# Patient Record
Sex: Female | Born: 1955
Health system: Southern US, Community
[De-identification: ages and names within clinical notes are randomized; demographics above are authoritative.]

## PROBLEM LIST (undated history)

## (undated) ENCOUNTER — Emergency Department: Admission: EM | Payer: BC Managed Care – PPO

## (undated) DIAGNOSIS — G5700 Lesion of sciatic nerve, unspecified lower limb: Secondary | ICD-10-CM

## (undated) DIAGNOSIS — I10 Essential (primary) hypertension: Secondary | ICD-10-CM

## (undated) DIAGNOSIS — I491 Atrial premature depolarization: Secondary | ICD-10-CM

## (undated) DIAGNOSIS — T7840XA Allergy, unspecified, initial encounter: Secondary | ICD-10-CM

## (undated) DIAGNOSIS — G709 Myoneural disorder, unspecified: Secondary | ICD-10-CM

## (undated) DIAGNOSIS — R011 Cardiac murmur, unspecified: Secondary | ICD-10-CM

## (undated) DIAGNOSIS — H269 Unspecified cataract: Secondary | ICD-10-CM

## (undated) DIAGNOSIS — D649 Anemia, unspecified: Secondary | ICD-10-CM

## (undated) DIAGNOSIS — M1990 Unspecified osteoarthritis, unspecified site: Secondary | ICD-10-CM

## (undated) DIAGNOSIS — C801 Malignant (primary) neoplasm, unspecified: Secondary | ICD-10-CM

## (undated) HISTORY — DX: Atrial premature depolarization: I49.1

## (undated) HISTORY — DX: Anemia, unspecified: D64.9

## (undated) HISTORY — DX: Myoneural disorder, unspecified: G70.9

## (undated) HISTORY — PX: OTHER SURGICAL HISTORY: SHX169

## (undated) HISTORY — DX: Unspecified cataract: H26.9

## (undated) HISTORY — PX: BREAST RECONSTRUCTION: SHX9

## (undated) HISTORY — DX: Lesion of sciatic nerve, unspecified lower limb: G57.00

## (undated) HISTORY — PX: COLONOSCOPY: SHX174

## (undated) HISTORY — DX: Malignant (primary) neoplasm, unspecified: C80.1

## (undated) HISTORY — PX: BREAST LUMPECTOMY WITH AXILLARY LYMPH NODE DISSECTION: SHX5756

## (undated) HISTORY — DX: Unspecified osteoarthritis, unspecified site: M19.90

## (undated) HISTORY — DX: Cardiac murmur, unspecified: R01.1

## (undated) HISTORY — DX: Allergy, unspecified, initial encounter: T78.40XA

---

## 1997-05-31 ENCOUNTER — Ambulatory Visit (HOSPITAL_BASED_OUTPATIENT_CLINIC_OR_DEPARTMENT_OTHER): Admission: RE | Admit: 1997-05-31 | Discharge: 1997-05-31 | Payer: Self-pay | Admitting: General Surgery

## 1999-05-17 ENCOUNTER — Encounter: Admission: RE | Admit: 1999-05-17 | Discharge: 1999-05-17 | Payer: Self-pay | Admitting: *Deleted

## 1999-05-21 ENCOUNTER — Other Ambulatory Visit: Admission: RE | Admit: 1999-05-21 | Discharge: 1999-05-21 | Payer: Self-pay | Admitting: *Deleted

## 2000-05-28 ENCOUNTER — Encounter: Admission: RE | Admit: 2000-05-28 | Discharge: 2000-05-28 | Payer: Self-pay | Admitting: *Deleted

## 2000-06-26 ENCOUNTER — Other Ambulatory Visit: Admission: RE | Admit: 2000-06-26 | Discharge: 2000-06-26 | Payer: Self-pay | Admitting: *Deleted

## 2001-07-08 ENCOUNTER — Other Ambulatory Visit: Admission: RE | Admit: 2001-07-08 | Discharge: 2001-07-08 | Payer: Self-pay | Admitting: *Deleted

## 2001-07-22 ENCOUNTER — Encounter: Admission: RE | Admit: 2001-07-22 | Discharge: 2001-07-22 | Payer: Self-pay | Admitting: *Deleted

## 2001-12-09 ENCOUNTER — Encounter: Payer: Self-pay | Admitting: Family Medicine

## 2001-12-09 ENCOUNTER — Ambulatory Visit (HOSPITAL_COMMUNITY): Admission: RE | Admit: 2001-12-09 | Discharge: 2001-12-09 | Payer: Self-pay | Admitting: Family Medicine

## 2002-07-26 ENCOUNTER — Encounter: Admission: RE | Admit: 2002-07-26 | Discharge: 2002-07-26 | Payer: Self-pay | Admitting: *Deleted

## 2002-08-18 ENCOUNTER — Other Ambulatory Visit: Admission: RE | Admit: 2002-08-18 | Discharge: 2002-08-18 | Payer: Self-pay | Admitting: *Deleted

## 2003-07-04 ENCOUNTER — Ambulatory Visit (HOSPITAL_COMMUNITY): Admission: RE | Admit: 2003-07-04 | Discharge: 2003-07-04 | Payer: Self-pay | Admitting: Family Medicine

## 2003-08-24 ENCOUNTER — Encounter: Admission: RE | Admit: 2003-08-24 | Discharge: 2003-08-24 | Payer: Self-pay | Admitting: *Deleted

## 2003-11-08 ENCOUNTER — Ambulatory Visit (HOSPITAL_COMMUNITY): Admission: RE | Admit: 2003-11-08 | Discharge: 2003-11-08 | Payer: Self-pay | Admitting: Family Medicine

## 2004-09-09 ENCOUNTER — Encounter: Admission: RE | Admit: 2004-09-09 | Discharge: 2004-09-09 | Payer: Self-pay | Admitting: *Deleted

## 2005-10-27 ENCOUNTER — Encounter: Admission: RE | Admit: 2005-10-27 | Discharge: 2005-10-27 | Payer: Self-pay | Admitting: *Deleted

## 2006-09-14 ENCOUNTER — Ambulatory Visit: Payer: Self-pay | Admitting: Internal Medicine

## 2006-10-05 ENCOUNTER — Ambulatory Visit: Payer: Self-pay | Admitting: Internal Medicine

## 2006-11-02 ENCOUNTER — Encounter: Admission: RE | Admit: 2006-11-02 | Discharge: 2006-11-02 | Payer: Self-pay | Admitting: Family Medicine

## 2007-11-17 ENCOUNTER — Encounter: Admission: RE | Admit: 2007-11-17 | Discharge: 2007-11-17 | Payer: Self-pay | Admitting: Family Medicine

## 2008-12-04 ENCOUNTER — Encounter: Admission: RE | Admit: 2008-12-04 | Discharge: 2008-12-04 | Payer: Self-pay | Admitting: Obstetrics and Gynecology

## 2009-12-07 ENCOUNTER — Encounter: Admission: RE | Admit: 2009-12-07 | Discharge: 2009-12-07 | Payer: Self-pay | Admitting: Family Medicine

## 2011-09-09 ENCOUNTER — Other Ambulatory Visit: Payer: Self-pay | Admitting: Family Medicine

## 2011-09-09 DIAGNOSIS — Z1231 Encounter for screening mammogram for malignant neoplasm of breast: Secondary | ICD-10-CM

## 2011-09-30 ENCOUNTER — Ambulatory Visit
Admission: RE | Admit: 2011-09-30 | Discharge: 2011-09-30 | Disposition: A | Discharge status: Home / Self Care | Payer: Self-pay | Source: Ambulatory Visit | Attending: Family Medicine | Admitting: Family Medicine

## 2011-09-30 DIAGNOSIS — Z1231 Encounter for screening mammogram for malignant neoplasm of breast: Secondary | ICD-10-CM

## 2012-11-25 ENCOUNTER — Ambulatory Visit: Payer: Self-pay

## 2012-11-25 LAB — RAPID STREP-A WITH REFLX: Micro Text Report: NEGATIVE

## 2012-11-28 LAB — BETA STREP CULTURE(ARMC)

## 2012-12-23 ENCOUNTER — Other Ambulatory Visit: Payer: Self-pay

## 2012-12-23 DIAGNOSIS — Z853 Personal history of malignant neoplasm of breast: Secondary | ICD-10-CM

## 2012-12-23 DIAGNOSIS — Z1231 Encounter for screening mammogram for malignant neoplasm of breast: Secondary | ICD-10-CM

## 2012-12-23 DIAGNOSIS — Z9011 Acquired absence of right breast and nipple: Secondary | ICD-10-CM

## 2013-01-27 ENCOUNTER — Ambulatory Visit
Admission: RE | Admit: 2013-01-27 | Discharge: 2013-01-27 | Disposition: A | Discharge status: Home / Self Care | Payer: BC Managed Care – PPO | Source: Ambulatory Visit

## 2013-01-27 ENCOUNTER — Other Ambulatory Visit: Payer: Self-pay

## 2013-01-27 DIAGNOSIS — Z9011 Acquired absence of right breast and nipple: Secondary | ICD-10-CM

## 2013-01-27 DIAGNOSIS — Z853 Personal history of malignant neoplasm of breast: Secondary | ICD-10-CM

## 2013-01-27 DIAGNOSIS — Z1231 Encounter for screening mammogram for malignant neoplasm of breast: Secondary | ICD-10-CM

## 2015-02-09 ENCOUNTER — Encounter: Payer: Self-pay | Admitting: Internal Medicine

## 2015-05-22 ENCOUNTER — Ambulatory Visit
Admission: RE | Admit: 2015-05-22 | Discharge: 2015-05-22 | Disposition: A | Discharge status: Home / Self Care | Payer: BLUE CROSS/BLUE SHIELD | Source: Ambulatory Visit | Attending: Family Medicine | Admitting: Family Medicine

## 2015-05-22 ENCOUNTER — Other Ambulatory Visit: Payer: Self-pay | Admitting: Family Medicine

## 2015-05-22 DIAGNOSIS — M79672 Pain in left foot: Secondary | ICD-10-CM

## 2015-06-06 DIAGNOSIS — A084 Viral intestinal infection, unspecified: Secondary | ICD-10-CM | POA: Diagnosis not present

## 2015-06-27 DIAGNOSIS — F430 Acute stress reaction: Secondary | ICD-10-CM | POA: Diagnosis not present

## 2015-07-03 DIAGNOSIS — Z1231 Encounter for screening mammogram for malignant neoplasm of breast: Secondary | ICD-10-CM | POA: Diagnosis not present

## 2015-07-03 DIAGNOSIS — Z01419 Encounter for gynecological examination (general) (routine) without abnormal findings: Secondary | ICD-10-CM | POA: Diagnosis not present

## 2015-07-03 DIAGNOSIS — N760 Acute vaginitis: Secondary | ICD-10-CM | POA: Diagnosis not present

## 2015-07-03 DIAGNOSIS — Z6829 Body mass index (BMI) 29.0-29.9, adult: Secondary | ICD-10-CM | POA: Diagnosis not present

## 2015-07-11 DIAGNOSIS — F430 Acute stress reaction: Secondary | ICD-10-CM | POA: Diagnosis not present

## 2015-09-07 DIAGNOSIS — I10 Essential (primary) hypertension: Secondary | ICD-10-CM | POA: Diagnosis not present

## 2015-09-07 DIAGNOSIS — F419 Anxiety disorder, unspecified: Secondary | ICD-10-CM | POA: Diagnosis not present

## 2015-10-05 DIAGNOSIS — I10 Essential (primary) hypertension: Secondary | ICD-10-CM | POA: Diagnosis not present

## 2015-10-05 DIAGNOSIS — F430 Acute stress reaction: Secondary | ICD-10-CM | POA: Diagnosis not present

## 2015-10-05 DIAGNOSIS — F419 Anxiety disorder, unspecified: Secondary | ICD-10-CM | POA: Diagnosis not present

## 2015-10-15 ENCOUNTER — Encounter: Payer: Self-pay | Admitting: Podiatry

## 2015-10-15 ENCOUNTER — Ambulatory Visit (INDEPENDENT_AMBULATORY_CARE_PROVIDER_SITE_OTHER): Payer: BLUE CROSS/BLUE SHIELD | Admitting: Podiatry

## 2015-10-15 ENCOUNTER — Ambulatory Visit: Payer: Self-pay

## 2015-10-15 VITALS — BP 166/104 | Resp 16 | Ht 64.0 in | Wt 167.0 lb

## 2015-10-15 DIAGNOSIS — M79672 Pain in left foot: Secondary | ICD-10-CM | POA: Diagnosis not present

## 2015-10-15 DIAGNOSIS — M2042 Other hammer toe(s) (acquired), left foot: Secondary | ICD-10-CM

## 2015-10-15 DIAGNOSIS — M779 Enthesopathy, unspecified: Secondary | ICD-10-CM | POA: Diagnosis not present

## 2015-10-15 MED ORDER — TRIAMCINOLONE ACETONIDE 10 MG/ML IJ SUSP
10.0000 mg | Freq: Once | INTRAMUSCULAR | Status: AC
  Administered 2015-10-15: 10 mg

## 2015-10-15 NOTE — Progress Notes (Signed)
   Subjective:    Patient ID: Sara Lara, female    DOB: 10/11/1955, 60 y.o.   MRN: [id number]  HPI Chief Complaint  Patient presents with  . Toe Pain    L 2nd and 3rd toe pain x 6 mo.  States "worse with walking and exercise." X-rays in chart  from PCP in May.        Review of Systems  All other systems reviewed and are negative.      Objective:   Physical Exam        Assessment & Plan:

## 2015-11-01 ENCOUNTER — Ambulatory Visit: Payer: BLUE CROSS/BLUE SHIELD | Admitting: Podiatry

## 2015-12-17 DIAGNOSIS — J0100 Acute maxillary sinusitis, unspecified: Secondary | ICD-10-CM | POA: Diagnosis not present

## 2015-12-17 DIAGNOSIS — J040 Acute laryngitis: Secondary | ICD-10-CM | POA: Diagnosis not present

## 2015-12-17 DIAGNOSIS — I10 Essential (primary) hypertension: Secondary | ICD-10-CM | POA: Diagnosis not present

## 2016-04-29 DIAGNOSIS — I10 Essential (primary) hypertension: Secondary | ICD-10-CM | POA: Diagnosis not present

## 2016-06-03 DIAGNOSIS — I10 Essential (primary) hypertension: Secondary | ICD-10-CM | POA: Diagnosis not present

## 2016-07-17 DIAGNOSIS — F430 Acute stress reaction: Secondary | ICD-10-CM | POA: Diagnosis not present

## 2016-07-17 DIAGNOSIS — J069 Acute upper respiratory infection, unspecified: Secondary | ICD-10-CM | POA: Diagnosis not present

## 2016-08-08 DIAGNOSIS — F430 Acute stress reaction: Secondary | ICD-10-CM | POA: Diagnosis not present

## 2016-10-27 ENCOUNTER — Encounter: Payer: Self-pay | Admitting: Internal Medicine

## 2016-11-13 DIAGNOSIS — Z6829 Body mass index (BMI) 29.0-29.9, adult: Secondary | ICD-10-CM | POA: Diagnosis not present

## 2016-11-13 DIAGNOSIS — Z01419 Encounter for gynecological examination (general) (routine) without abnormal findings: Secondary | ICD-10-CM | POA: Diagnosis not present

## 2016-11-13 DIAGNOSIS — Z1231 Encounter for screening mammogram for malignant neoplasm of breast: Secondary | ICD-10-CM | POA: Diagnosis not present

## 2017-02-25 DIAGNOSIS — S0501XA Injury of conjunctiva and corneal abrasion without foreign body, right eye, initial encounter: Secondary | ICD-10-CM | POA: Diagnosis not present

## 2017-10-06 DIAGNOSIS — R42 Dizziness and giddiness: Secondary | ICD-10-CM | POA: Diagnosis not present

## 2017-10-21 DIAGNOSIS — R42 Dizziness and giddiness: Secondary | ICD-10-CM | POA: Diagnosis not present

## 2017-10-21 DIAGNOSIS — I959 Hypotension, unspecified: Secondary | ICD-10-CM | POA: Diagnosis not present

## 2018-02-07 DIAGNOSIS — M545 Low back pain: Secondary | ICD-10-CM | POA: Diagnosis not present

## 2018-09-30 ENCOUNTER — Other Ambulatory Visit: Payer: Self-pay | Admitting: Obstetrics and Gynecology

## 2018-09-30 DIAGNOSIS — Z780 Asymptomatic menopausal state: Secondary | ICD-10-CM

## 2019-03-01 DIAGNOSIS — E785 Hyperlipidemia, unspecified: Secondary | ICD-10-CM | POA: Diagnosis not present

## 2019-03-01 DIAGNOSIS — F419 Anxiety disorder, unspecified: Secondary | ICD-10-CM | POA: Diagnosis not present

## 2019-03-01 DIAGNOSIS — I10 Essential (primary) hypertension: Secondary | ICD-10-CM | POA: Diagnosis not present

## 2019-04-08 DIAGNOSIS — E785 Hyperlipidemia, unspecified: Secondary | ICD-10-CM | POA: Diagnosis not present

## 2019-04-08 DIAGNOSIS — I10 Essential (primary) hypertension: Secondary | ICD-10-CM | POA: Diagnosis not present

## 2019-09-24 DIAGNOSIS — Z03818 Encounter for observation for suspected exposure to other biological agents ruled out: Secondary | ICD-10-CM | POA: Diagnosis not present

## 2019-09-24 DIAGNOSIS — Z20822 Contact with and (suspected) exposure to covid-19: Secondary | ICD-10-CM | POA: Diagnosis not present

## 2019-10-05 ENCOUNTER — Emergency Department: Payer: BC Managed Care – PPO

## 2019-10-05 ENCOUNTER — Emergency Department
Admission: EM | Admit: 2019-10-05 | Discharge: 2019-10-05 | Disposition: A | Discharge status: Home / Self Care | Payer: BC Managed Care – PPO | Attending: Student in an Organized Health Care Education/Training Program | Admitting: Student in an Organized Health Care Education/Training Program

## 2019-10-05 ENCOUNTER — Encounter: Payer: Self-pay | Admitting: Radiology

## 2019-10-05 ENCOUNTER — Other Ambulatory Visit: Payer: Self-pay

## 2019-10-05 DIAGNOSIS — U071 COVID-19: Secondary | ICD-10-CM | POA: Diagnosis not present

## 2019-10-05 DIAGNOSIS — R109 Unspecified abdominal pain: Secondary | ICD-10-CM | POA: Insufficient documentation

## 2019-10-05 DIAGNOSIS — R112 Nausea with vomiting, unspecified: Secondary | ICD-10-CM | POA: Diagnosis not present

## 2019-10-05 DIAGNOSIS — I491 Atrial premature depolarization: Secondary | ICD-10-CM | POA: Diagnosis not present

## 2019-10-05 DIAGNOSIS — R42 Dizziness and giddiness: Secondary | ICD-10-CM | POA: Insufficient documentation

## 2019-10-05 DIAGNOSIS — R000 Tachycardia, unspecified: Secondary | ICD-10-CM | POA: Diagnosis not present

## 2019-10-05 DIAGNOSIS — R1110 Vomiting, unspecified: Secondary | ICD-10-CM | POA: Diagnosis not present

## 2019-10-05 DIAGNOSIS — R05 Cough: Secondary | ICD-10-CM | POA: Diagnosis not present

## 2019-10-05 DIAGNOSIS — R0902 Hypoxemia: Secondary | ICD-10-CM | POA: Diagnosis not present

## 2019-10-05 LAB — LIPASE, BLOOD: Lipase: 31 U/L (ref 11–51)

## 2019-10-05 LAB — BASIC METABOLIC PANEL
Anion gap: 10 (ref 5–15)
BUN: 16 mg/dL (ref 8–23)
CO2: 30 mmol/L (ref 22–32)
Calcium: 8.8 mg/dL — ABNORMAL LOW (ref 8.9–10.3)
Chloride: 99 mmol/L (ref 98–111)
Creatinine, Ser: 0.99 mg/dL (ref 0.44–1.00)
GFR calc Af Amer: >60 mL/min (ref >60)
GFR calc non Af Amer: >60 mL/min (ref >60)
Glucose, Bld: 134 mg/dL — ABNORMAL HIGH (ref 70–99)
Potassium: 3.8 mmol/L (ref 3.5–5.1)
Sodium: 139 mmol/L (ref 135–145)

## 2019-10-05 LAB — HEPATIC FUNCTION PANEL
ALT: 44 U/L (ref 0–44)
AST: 71 U/L — ABNORMAL HIGH (ref 15–41)
Albumin: 3.7 g/dL (ref 3.5–5.0)
Alkaline Phosphatase: 64 U/L (ref 38–126)
Bilirubin, Direct: 0.5 mg/dL — ABNORMAL HIGH (ref 0.0–0.2)
Indirect Bilirubin: 0.5 mg/dL (ref 0.3–0.9)
Total Bilirubin: 1 mg/dL (ref 0.3–1.2)
Total Protein: 7.4 g/dL (ref 6.5–8.1)

## 2019-10-05 LAB — URINALYSIS, COMPLETE (UACMP) WITH MICROSCOPIC
Bacteria, UA: NONE SEEN
Bilirubin Urine: NEGATIVE
Glucose, UA: NEGATIVE mg/dL
Hgb urine dipstick: NEGATIVE
Ketones, ur: NEGATIVE mg/dL
Nitrite: NEGATIVE
Protein, ur: NEGATIVE mg/dL
Specific Gravity, Urine: 1.033 — ABNORMAL HIGH (ref 1.005–1.030)
pH: 6 (ref 5.0–8.0)

## 2019-10-05 LAB — CBC
HCT: 37.5 % (ref 36.0–46.0)
Hemoglobin: 12.4 g/dL (ref 12.0–15.0)
MCH: 31.1 pg (ref 26.0–34.0)
MCHC: 33.1 g/dL (ref 30.0–36.0)
MCV: 94 fL (ref 80.0–100.0)
Platelets: 168 10*3/uL (ref 150–400)
RBC: 3.99 MIL/uL (ref 3.87–5.11)
RDW: 12.7 % (ref 11.5–15.5)
WBC: 4.7 10*3/uL (ref 4.0–10.5)
nRBC: 0 % (ref 0.0–0.2)

## 2019-10-05 LAB — TROPONIN I (HIGH SENSITIVITY)
Troponin I (High Sensitivity): 12 ng/L (ref <18)
Troponin I (High Sensitivity): 12 ng/L (ref <18)

## 2019-10-05 LAB — SARS CORONAVIRUS 2 BY RT PCR (HOSPITAL ORDER, PERFORMED IN ~~LOC~~ HOSPITAL LAB): SARS Coronavirus 2: POSITIVE — AB

## 2019-10-05 MED ORDER — IOHEXOL 300 MG/ML  SOLN
100.0000 mL | Freq: Once | INTRAMUSCULAR | Status: AC | PRN
  Administered 2019-10-05: 100 mL via INTRAVENOUS

## 2019-10-05 MED ORDER — ONDANSETRON HCL 4 MG/2ML IJ SOLN
4.0000 mg | Freq: Once | INTRAMUSCULAR | Status: AC
  Administered 2019-10-05: 4 mg via INTRAVENOUS
  Filled 2019-10-05: qty 2

## 2019-10-05 MED ORDER — MORPHINE SULFATE (PF) 4 MG/ML IV SOLN
4.0000 mg | INTRAVENOUS | Status: DC | PRN
  Administered 2019-10-05: 4 mg via INTRAVENOUS
  Filled 2019-10-05: qty 1

## 2019-10-05 MED ORDER — ONDANSETRON 4 MG PO TBDP: 4.0000 mg | ORAL_TABLET | Freq: Three times a day (TID) | ORAL | 0 refills | Status: DC | PRN

## 2019-10-05 MED ORDER — SODIUM CHLORIDE 0.9 % IV BOLUS
1000.0000 mL | Freq: Once | INTRAVENOUS | Status: AC
  Administered 2019-10-05: 1000 mL via INTRAVENOUS

## 2019-10-05 NOTE — ED Notes (Signed)
Pt reports improved nausea.  

## 2019-10-05 NOTE — ED Provider Notes (Signed)
[facility] Emergency Department Provider Note    First MD Initiated Contact with Patient 10/05/19 1503     (approximate)  I have reviewed the triage vital signs and the nursing notes.   HISTORY  Chief Complaint Nausea, Emesis, and Dizziness    HPI Sara Lara is a 64 y.o. female presents to the ER for evaluation of multiple complaints today.  Primarily she is complaining of nausea vomiting diarrhea.  Is having some abdominal pain.  Just recently got back from a cruise.  States that today she was feeling weak and dizzy after multiple episodes of nausea and vomiting.  Felt like she was about to pass out.  Called EMS.  Was given bolus of IV fluids.  Denies any chest pain.  No measured fevers.  Is never had pain like this before.    History reviewed. No pertinent past medical history. No family history on file. No past surgical history on file. There are no problems to display for this patient.     Prior to Admission medications   Medication Sig Start Date End Date Taking? Authorizing Provider  ondansetron (ZOFRAN-ODT) 4 MG disintegrating tablet Take 1 tablet (4 mg total) by mouth every 8 (eight) hours as needed for nausea or vomiting. 10/05/19   Merlyn Lot, MD    Allergies Percocet [oxycodone-acetaminophen] and Tramadol    Social History Social History   Tobacco Use  . Smoking status: Never Smoker  . Smokeless tobacco: Never Used  Substance Use Topics  . Alcohol use: Not on file  . Drug use: Not on file    Review of Systems Patient denies headaches, rhinorrhea, blurry vision, numbness, shortness of breath, chest pain, edema, cough, abdominal pain, nausea, vomiting, diarrhea, dysuria, fevers, rashes or hallucinations unless otherwise stated above in HPI. ____________________________________________   PHYSICAL EXAM:  VITAL SIGNS: Vitals:   10/05/19 1018 10/05/19 1434  BP: 115/82 (!) 138/93  Pulse: 84 78  Resp: 18 15  Temp:  98.2 F (36.8 C) 98.5 F (36.9 C)  SpO2: 100% 98%    Constitutional: Alert and oriented.  Eyes: Conjunctivae are normal.  Head: Atraumatic. Nose: No congestion/rhinnorhea. Mouth/Throat: Mucous membranes are moist.   Neck: No stridor. Painless ROM.  Cardiovascular: Normal rate, regular rhythm. Grossly normal heart sounds.  Good peripheral circulation. Respiratory: Normal respiratory effort.  No retractions. Lungs CTAB. Gastrointestinal: Soft and nontender. No distention. No abdominal bruits. No CVA tenderness. Genitourinary:  Musculoskeletal: No lower extremity tenderness nor edema.  No joint effusions. Neurologic:  Normal speech and language. No gross focal neurologic deficits are appreciated. No facial droop Skin:  Skin is warm, dry and intact. No rash noted. Psychiatric: Mood and affect are normal. Speech and behavior are normal.  ____________________________________________   LABS (all labs ordered are listed, but only abnormal results are displayed)  Results for orders placed or performed during the hospital encounter of 10/05/19 (from the past 24 hour(s))  Basic metabolic panel     Status: Abnormal   Collection Time: 10/05/19 10:22 AM  Result Value Ref Range   Sodium 139 135 - 145 mmol/L   Potassium 3.8 3.5 - 5.1 mmol/L   Chloride 99 98 - 111 mmol/L   CO2 30 22 - 32 mmol/L   Glucose, Bld 134 (H) 70 - 99 mg/dL   BUN 16 8 - 23 mg/dL   Creatinine, Ser 0.99 0.44 - 1.00 mg/dL   Calcium 8.8 (L) 8.9 - 10.3 mg/dL   GFR calc non Af Amer >  60 >60 mL/min   GFR calc Af Amer >60 >60 mL/min   Anion gap 10 5 - 15  CBC     Status: None   Collection Time: 10/05/19 10:22 AM  Result Value Ref Range   WBC 4.7 4.0 - 10.5 K/uL   RBC 3.99 3.87 - 5.11 MIL/uL   Hemoglobin 12.4 12.0 - 15.0 g/dL   HCT 37.5 36 - 46 %   MCV 94.0 80.0 - 100.0 fL   MCH 31.1 26.0 - 34.0 pg   MCHC 33.1 30.0 - 36.0 g/dL   RDW 12.7 11.5 - 15.5 %   Platelets 168 150 - 400 K/uL   nRBC 0.0 0.0 - 0.2 %  Troponin I  (High Sensitivity)     Status: None   Collection Time: 10/05/19 10:22 AM  Result Value Ref Range   Troponin I (High Sensitivity) 12 <18 ng/L  Hepatic function panel     Status: Abnormal   Collection Time: 10/05/19 10:22 AM  Result Value Ref Range   Total Protein 7.4 6.5 - 8.1 g/dL   Albumin 3.7 3.5 - 5.0 g/dL   AST 71 (H) 15 - 41 U/L   ALT 44 0 - 44 U/L   Alkaline Phosphatase 64 38 - 126 U/L   Total Bilirubin 1.0 0.3 - 1.2 mg/dL   Bilirubin, Direct 0.5 (H) 0.0 - 0.2 mg/dL   Indirect Bilirubin 0.5 0.3 - 0.9 mg/dL  Lipase, blood     Status: None   Collection Time: 10/05/19 10:22 AM  Result Value Ref Range   Lipase 31 11 - 51 U/L  Troponin I (High Sensitivity)     Status: None   Collection Time: 10/05/19  3:44 PM  Result Value Ref Range   Troponin I (High Sensitivity) 12 <18 ng/L  SARS Coronavirus 2 by RT PCR (hospital order, performed in [place] hospital lab) Nasopharyngeal Nasopharyngeal Swab     Status: Abnormal   Collection Time: 10/05/19  3:45 PM   Specimen: Nasopharyngeal Swab  Result Value Ref Range   SARS Coronavirus 2 POSITIVE (A) NEGATIVE  Urinalysis, Complete w Microscopic     Status: Abnormal   Collection Time: 10/05/19  4:28 PM  Result Value Ref Range   Color, Urine YELLOW (A) YELLOW   APPearance CLOUDY (A) CLEAR   Specific Gravity, Urine 1.033 (H) 1.005 - 1.030   pH 6.0 5.0 - 8.0   Glucose, UA NEGATIVE NEGATIVE mg/dL   Hgb urine dipstick NEGATIVE NEGATIVE   Bilirubin Urine NEGATIVE NEGATIVE   Ketones, ur NEGATIVE NEGATIVE mg/dL   Protein, ur NEGATIVE NEGATIVE mg/dL   Nitrite NEGATIVE NEGATIVE   Leukocytes,Ua MODERATE (A) NEGATIVE   RBC / HPF 0-5 0 - 5 RBC/hpf   WBC, UA 6-10 0 - 5 WBC/hpf   Bacteria, UA NONE SEEN NONE SEEN   Squamous Epithelial / LPF 6-10 0 - 5   ____________________________________________  EKG My review and personal interpretation at Time: 10:06   Indication: dizziness  Rate: 75  Rhythm: sinus Axis: normal Other: normal intervals,  no stemi ____________________________________________  RADIOLOGY  I personally reviewed all radiographic images ordered to evaluate for the above acute complaints and reviewed radiology reports and findings.  These findings were personally discussed with the patient.  Please see medical record for radiology report.  ____________________________________________   PROCEDURES  Procedure(s) performed:  Procedures    Critical Care performed: no ____________________________________________   INITIAL IMPRESSION / ASSESSMENT AND PLAN / ED COURSE  Pertinent labs & imaging  results that were available during my care of the patient were reviewed by me and considered in my medical decision making (see chart for details).   DDX: Enteritis, gastritis, dehydration, electrolyte abnormality, dysrhythmia, pneumonia, Covid, foodborne illness, diverticulitis  Sara Lara is a 64 y.o. who presents to the ED with symptoms as described above.  Patient without significant past medical history.  Appears clinically well.  Afebrile hemodynamically stable.  Exam is reassuring but does complain of diffuse abdominal pain.  No white count but given her age and presenting symptoms will order CT imaging.  Will provide IV hydration  Clinical Course as of Oct 05 1806  Wed Oct 05, 2019  3734 Patient did test positive for Covid which would explain her symptoms.  She is not toxic.  She is tolerating p.o.  Vital signs remained stable.  She is able to ambulate without any hypoxia.  Will be appropriate for outpatient management.  Will be given referral to monoclonal antibody infusion center she would be a candidate based on her history of hypertension.  Discussed signs and symptoms for which she should return to the ER.   [PR]    Clinical Course User Index [PR] Merlyn Lot, MD    The patient was evaluated in Emergency Department today for the symptoms described in the history of present illness. He/she was  evaluated in the context of the global COVID-19 pandemic, which necessitated consideration that the patient might be at risk for infection with the SARS-CoV-2 virus that causes COVID-19. Institutional protocols and algorithms that pertain to the evaluation of patients at risk for COVID-19 are in a state of rapid change based on information released by regulatory bodies including the CDC and federal and state organizations. These policies and algorithms were followed during the patient's care in the ED.  As part of my medical decision making, I reviewed the following data within the [facility] notes reviewed and incorporated, Labs reviewed, notes from prior ED visits and [place] Controlled Substance Database   ____________________________________________   FINAL CLINICAL IMPRESSION(S) / ED DIAGNOSES  Final diagnoses:  COVID-19 virus infection      NEW MEDICATIONS STARTED DURING THIS VISIT:  New Prescriptions   ONDANSETRON (ZOFRAN-ODT) 4 MG DISINTEGRATING TABLET    Take 1 tablet (4 mg total) by mouth every 8 (eight) hours as needed for nausea or vomiting.     Note:  This document was prepared using Dragon voice recognition software and may include unintentional dictation errors.    Merlyn Lot, MD 10/05/19 [phone]

## 2019-10-05 NOTE — ED Notes (Signed)
Pt with ambulatory SpO2 maintained at 96-99%, no difficulty noted to walking or breathing.

## 2019-10-05 NOTE — ED Notes (Signed)
Pt reports n/v/d for several days. Reports recently coming back from a cruise where there was a concern for norovirus. Pt reports intermittent generalized abdominal pain. Pt denies fever or cough.

## 2019-10-05 NOTE — ED Triage Notes (Signed)
Per ems pt with couplets, some bigemeny on ekg on transport in. Pt states nausea and vomiting that began today, with abd pain, dizziness. Pt denies chest pain, ems gave 4mg  iv zofran and 563ml NS iv.

## 2019-10-06 ENCOUNTER — Other Ambulatory Visit: Payer: Self-pay | Admitting: Internal Medicine

## 2019-10-06 DIAGNOSIS — U071 COVID-19: Secondary | ICD-10-CM

## 2019-10-06 DIAGNOSIS — I10 Essential (primary) hypertension: Secondary | ICD-10-CM

## 2019-10-06 NOTE — Progress Notes (Signed)
I connected by phone with Sara Lara on 10/06/2019 at 11:40 AM to discuss the potential use of a new treatment for mild to moderate COVID-19 viral infection in non-hospitalized patients.  This patient is a 64 y.o. female that meets the FDA criteria for Emergency Use Authorization of COVID monoclonal antibody casirivimab/imdevimab.  Has a (+) direct SARS-CoV-2 viral test result  Has mild or moderate COVID-19   Is NOT hospitalized due to COVID-19  Is within 10 days of symptom onset  Has at least one of the high risk factor(s) for progression to severe COVID-19 and/or hospitalization as defined in EUA.  Specific high risk criteria : Cardiovascular disease or hypertension   I have spoken and communicated the following to the patient or parent/caregiver regarding COVID monoclonal antibody treatment:  1. FDA has authorized the emergency use for the treatment of mild to moderate COVID-19 in adults and pediatric patients with positive results of direct SARS-CoV-2 viral testing who are 46 years of age and older weighing at least 40 kg, and who are at high risk for progressing to severe COVID-19 and/or hospitalization.  2. The significant known and potential risks and benefits of COVID monoclonal antibody, and the extent to which such potential risks and benefits are unknown.  3. Information on available alternative treatments and the risks and benefits of those alternatives, including clinical trials.  4. Patients treated with COVID monoclonal antibody should continue to self-isolate and use infection control measures (e.g., wear mask, isolate, social distance, avoid sharing personal items, clean and disinfect "high touch" surfaces, and frequent handwashing) according to CDC guidelines.   5. The patient or parent/caregiver has the option to accept or refuse COVID monoclonal antibody treatment.  After reviewing this information with the patient, The patient agreed to proceed with receiving  casirivimab\imdevimab infusion and will be provided a copy of the Fact sheet prior to receiving the infusion.   Symptom onset 9/13. Infusion scheduled for 9/17 at 1030.    Sara Commander, NP 10/06/2019 11:40 AM

## 2019-10-07 ENCOUNTER — Ambulatory Visit (HOSPITAL_COMMUNITY)
Admission: RE | Admit: 2019-10-07 | Discharge: 2019-10-07 | Disposition: A | Discharge status: Home / Self Care | Payer: BC Managed Care – PPO | Source: Ambulatory Visit | Attending: Pulmonary Disease | Admitting: Pulmonary Disease

## 2019-10-07 DIAGNOSIS — U071 COVID-19: Secondary | ICD-10-CM | POA: Diagnosis not present

## 2019-10-07 DIAGNOSIS — I10 Essential (primary) hypertension: Secondary | ICD-10-CM | POA: Diagnosis not present

## 2019-10-07 MED ORDER — FAMOTIDINE IN NACL 20-0.9 MG/50ML-% IV SOLN: 20.0000 mg | Freq: Once | INTRAVENOUS | Status: DC | PRN

## 2019-10-07 MED ORDER — DIPHENHYDRAMINE HCL 50 MG/ML IJ SOLN: 50.0000 mg | Freq: Once | INTRAMUSCULAR | Status: DC | PRN

## 2019-10-07 MED ORDER — ALBUTEROL SULFATE HFA 108 (90 BASE) MCG/ACT IN AERS: 2.0000 | INHALATION_SPRAY | Freq: Once | RESPIRATORY_TRACT | Status: DC | PRN

## 2019-10-07 MED ORDER — SODIUM CHLORIDE 0.9 % IV SOLN
1200.0000 mg | Freq: Once | INTRAVENOUS | Status: AC
  Administered 2019-10-07: 1200 mg via INTRAVENOUS

## 2019-10-07 MED ORDER — SODIUM CHLORIDE 0.9 % IV SOLN: INTRAVENOUS | Status: DC | PRN

## 2019-10-07 MED ORDER — EPINEPHRINE 0.3 MG/0.3ML IJ SOAJ: 0.3000 mg | Freq: Once | INTRAMUSCULAR | Status: DC | PRN

## 2019-10-07 MED ORDER — METHYLPREDNISOLONE SODIUM SUCC 125 MG IJ SOLR: 125.0000 mg | Freq: Once | INTRAMUSCULAR | Status: DC | PRN

## 2019-10-07 NOTE — Discharge Instructions (Signed)
COVID-19 COVID-19 is a respiratory infection that is caused by a virus called severe acute respiratory syndrome coronavirus 2 (SARS-CoV-2). The disease is also known as coronavirus disease or novel coronavirus. In some people, the virus may not cause any symptoms. In others, it may cause a serious infection. The infection can get worse quickly and can lead to complications, such as:  Pneumonia, or infection of the lungs.  Acute respiratory distress syndrome or ARDS. This is a condition in which fluid build-up in the lungs prevents the lungs from filling with air and passing oxygen into the blood.  Acute respiratory failure. This is a condition in which there is not enough oxygen passing from the lungs to the body or when carbon dioxide is not passing from the lungs out of the body.  Sepsis or septic shock. This is a serious bodily reaction to an infection.  Blood clotting problems.  Secondary infections due to bacteria or fungus.  Organ failure. This is when your body's organs stop working. The virus that causes COVID-19 is contagious. This means that it can spread from person to person through droplets from coughs and sneezes (respiratory secretions). What are the causes? This illness is caused by a virus. You may catch the virus by:  Breathing in droplets from an infected person. Droplets can be spread by a person breathing, speaking, singing, coughing, or sneezing.  Touching something, like a table or a doorknob, that was exposed to the virus (contaminated) and then touching your mouth, nose, or eyes. What increases the risk? Risk for infection You are more likely to be infected with this virus if you:  Are within 6 feet (2 meters) of a person with COVID-19.  Provide care for or live with a person who is infected with COVID-19.  Spend time in crowded indoor spaces or live in shared housing. Risk for serious illness You are more likely to become seriously ill from the virus if  you:  Are 50 years of age or older. The higher your age, the more you are at risk for serious illness.  Live in a nursing home or long-term care facility.  Have cancer.  Have a long-term (chronic) disease such as: ? Chronic lung disease, including chronic obstructive pulmonary disease or asthma. ? A long-term disease that lowers your body's ability to fight infection (immunocompromised). ? Heart disease, including heart failure, a condition in which the arteries that lead to the heart become narrow or blocked (coronary artery disease), a disease which makes the heart muscle thick, weak, or stiff (cardiomyopathy). ? Diabetes. ? Chronic kidney disease. ? Sickle cell disease, a condition in which red blood cells have an abnormal "sickle" shape. ? Liver disease.  Are obese. What are the signs or symptoms? Symptoms of this condition can range from mild to severe. Symptoms may appear any time from 2 to 14 days after being exposed to the virus. They include:  A fever or chills.  A cough.  Difficulty breathing.  Headaches, body aches, or muscle aches.  Runny or stuffy (congested) nose.  A sore throat.  New loss of taste or smell. Some people may also have stomach problems, such as nausea, vomiting, or diarrhea. Other people may not have any symptoms of COVID-19. How is this diagnosed? This condition may be diagnosed based on:  Your signs and symptoms, especially if: ? You live in an area with a COVID-19 outbreak. ? You recently traveled to or from an area where the virus is common. ? You   provide care for or live with a person who was diagnosed with COVID-19. ? You were exposed to a person who was diagnosed with COVID-19.  A physical exam.  Lab tests, which may include: ? Taking a sample of fluid from the back of your nose and throat (nasopharyngeal fluid), your nose, or your throat using a swab. ? A sample of mucus from your lungs (sputum). ? Blood tests.  Imaging tests,  which may include, X-rays, CT scan, or ultrasound. How is this treated? At present, there is no medicine to treat COVID-19. Medicines that treat other diseases are being used on a trial basis to see if they are effective against COVID-19. Your health care provider will talk with you about ways to treat your symptoms. For most people, the infection is mild and can be managed at home with rest, fluids, and over-the-counter medicines. Treatment for a serious infection usually takes places in a hospital intensive care unit (ICU). It may include one or more of the following treatments. These treatments are given until your symptoms improve.  Receiving fluids and medicines through an IV.  Supplemental oxygen. Extra oxygen is given through a tube in the nose, a face mask, or a hood.  Positioning you to lie on your stomach (prone position). This makes it easier for oxygen to get into the lungs.  Continuous positive airway pressure (CPAP) or bi-level positive airway pressure (BPAP) machine. This treatment uses mild air pressure to keep the airways open. A tube that is connected to a motor delivers oxygen to the body.  Ventilator. This treatment moves air into and out of the lungs by using a tube that is placed in your windpipe.  Tracheostomy. This is a procedure to create a hole in the neck so that a breathing tube can be inserted.  Extracorporeal membrane oxygenation (ECMO). This procedure gives the lungs a chance to recover by taking over the functions of the heart and lungs. It supplies oxygen to the body and removes carbon dioxide. Follow these instructions at home: Lifestyle  If you are sick, stay home except to get medical care. Your health care provider will tell you how long to stay home. Call your health care provider before you go for medical care.  Rest at home as told by your health care provider.  Do not use any products that contain nicotine or tobacco, such as cigarettes,  e-cigarettes, and chewing tobacco. If you need help quitting, ask your health care provider.  Return to your normal activities as told by your health care provider. Ask your health care provider what activities are safe for you. General instructions  Take over-the-counter and prescription medicines only as told by your health care provider.  Drink enough fluid to keep your urine pale yellow.  Keep all follow-up visits as told by your health care provider. This is important. How is this prevented?  There is no vaccine to help prevent COVID-19 infection. However, there are steps you can take to protect yourself and others from this virus. To protect yourself:   Do not travel to areas where COVID-19 is a risk. The areas where COVID-19 is reported change often. To identify high-risk areas and travel restrictions, check the CDC travel website: wwwnc.cdc.gov/travel/notices  If you live in, or must travel to, an area where COVID-19 is a risk, take precautions to avoid infection. ? Stay away from people who are sick. ? Wash your hands often with soap and water for 20 seconds. If soap and water   are not available, use an alcohol-based hand sanitizer. ? Avoid touching your mouth, face, eyes, or nose. ? Avoid going out in public, follow guidance from your state and local health authorities. ? If you must go out in public, wear a cloth face covering or face mask. Make sure your mask covers your nose and mouth. ? Avoid crowded indoor spaces. Stay at least 6 feet (2 meters) away from others. ? Disinfect objects and surfaces that are frequently touched every day. This may include:  Counters and tables.  Doorknobs and light switches.  Sinks and faucets.  Electronics, such as phones, remote controls, keyboards, computers, and tablets. To protect others: If you have symptoms of COVID-19, take steps to prevent the virus from spreading to others.  If you think you have a COVID-19 infection, contact  your health care provider right away. Tell your health care team that you think you may have a COVID-19 infection.  Stay home. Leave your house only to seek medical care. Do not use public transport.  Do not travel while you are sick.  Wash your hands often with soap and water for 20 seconds. If soap and water are not available, use alcohol-based hand sanitizer.  Stay away from other members of your household. Let healthy household members care for children and pets, if possible. If you have to care for children or pets, wash your hands often and wear a mask. If possible, stay in your own room, separate from others. Use a different bathroom.  Make sure that all people in your household wash their hands well and often.  Cough or sneeze into a tissue or your sleeve or elbow. Do not cough or sneeze into your hand or into the air.  Wear a cloth face covering or face mask. Make sure your mask covers your nose and mouth. Where to find more information  Centers for Disease Control and Prevention: www.cdc.gov/coronavirus/2019-ncov/index.html  World Health Organization: www.who.int/health-topics/coronavirus Contact a health care provider if:  You live in or have traveled to an area where COVID-19 is a risk and you have symptoms of the infection.  You have had contact with someone who has COVID-19 and you have symptoms of the infection. Get help right away if:  You have trouble breathing.  You have pain or pressure in your chest.  You have confusion.  You have bluish lips and fingernails.  You have difficulty waking from sleep.  You have symptoms that get worse. These symptoms may represent a serious problem that is an emergency. Do not wait to see if the symptoms will go away. Get medical help right away. Call your local emergency services (911 in the [place] Do not drive yourself to the hospital. Let the emergency medical personnel know if you think you have  COVID-19. Summary  COVID-19 is a respiratory infection that is caused by a virus. It is also known as coronavirus disease or novel coronavirus. It can cause serious infections, such as pneumonia, acute respiratory distress syndrome, acute respiratory failure, or sepsis.  The virus that causes COVID-19 is contagious. This means that it can spread from person to person through droplets from breathing, speaking, singing, coughing, or sneezing.  You are more likely to develop a serious illness if you are 50 years of age or older, have a weak immune system, live in a nursing home, or have chronic disease.  There is no medicine to treat COVID-19. Your health care provider will talk with you about ways to treat your symptoms.    Take steps to protect yourself and others from infection. Wash your hands often and disinfect objects and surfaces that are frequently touched every day. Stay away from people who are sick and wear a mask if you are sick. This information is not intended to replace advice given to you by your health care provider. Make sure you discuss any questions you have with your health care provider. Document Revised: 11/05/2018 Document Reviewed: 02/11/2018 Elsevier Patient Education  2020 [employer] What types of side effects do monoclonal antibody drugs cause?  Common side effects  In general, the more common side effects caused by monoclonal antibody drugs include: . Allergic reactions, such as hives or itching . Flu-like signs and symptoms, including chills, fatigue, fever, and muscle aches and pains . Nausea, vomiting . Diarrhea . Skin rashes . Low blood pressure   The CDC is recommending patients who receive monoclonal antibody treatments wait at least 90 days before being vaccinated.  Currently, there are no data on the safety and efficacy of mRNA COVID-19 vaccines in persons who received monoclonal antibodies or convalescent plasma as part of COVID-19 treatment. Based  on the estimated half-life of such therapies as well as evidence suggesting that reinfection is uncommon in the 90 days after initial infection, vaccination should be deferred for at least 90 days, as a precautionary measure until additional information becomes available, to avoid interference of the antibody treatment with vaccine-induced immune responses. 

## 2019-10-07 NOTE — Progress Notes (Signed)
  Diagnosis: COVID-19  Physician: Asencion Noble, MD  Procedure: [facility] Infusion Clinic Med: casirivimab\imdevimab infusion - Provided patient with casirivimab\imdevimab fact sheet for patients, parents and caregivers prior to infusion.  Complications: No immediate complications noted.  Discharge: Discharged home   Sara Lara 10/07/2019

## 2019-10-12 DIAGNOSIS — U071 COVID-19: Secondary | ICD-10-CM | POA: Diagnosis not present

## 2020-01-02 DIAGNOSIS — Z01419 Encounter for gynecological examination (general) (routine) without abnormal findings: Secondary | ICD-10-CM | POA: Diagnosis not present

## 2020-01-02 DIAGNOSIS — Z6829 Body mass index (BMI) 29.0-29.9, adult: Secondary | ICD-10-CM | POA: Diagnosis not present

## 2020-01-02 DIAGNOSIS — Z124 Encounter for screening for malignant neoplasm of cervix: Secondary | ICD-10-CM | POA: Diagnosis not present

## 2020-01-02 DIAGNOSIS — Z1231 Encounter for screening mammogram for malignant neoplasm of breast: Secondary | ICD-10-CM | POA: Diagnosis not present

## 2020-01-26 ENCOUNTER — Other Ambulatory Visit: Payer: Self-pay | Admitting: Obstetrics and Gynecology

## 2020-01-26 DIAGNOSIS — Z780 Asymptomatic menopausal state: Secondary | ICD-10-CM

## 2020-04-25 DIAGNOSIS — E559 Vitamin D deficiency, unspecified: Secondary | ICD-10-CM | POA: Diagnosis not present

## 2020-04-25 DIAGNOSIS — I10 Essential (primary) hypertension: Secondary | ICD-10-CM | POA: Diagnosis not present

## 2020-04-25 DIAGNOSIS — E785 Hyperlipidemia, unspecified: Secondary | ICD-10-CM | POA: Diagnosis not present

## 2020-04-25 DIAGNOSIS — F419 Anxiety disorder, unspecified: Secondary | ICD-10-CM | POA: Diagnosis not present

## 2020-06-15 ENCOUNTER — Ambulatory Visit
Admission: RE | Admit: 2020-06-15 | Discharge: 2020-06-15 | Disposition: A | Discharge status: Home / Self Care | Payer: BC Managed Care – PPO | Source: Ambulatory Visit | Attending: Obstetrics and Gynecology | Admitting: Obstetrics and Gynecology

## 2020-06-15 ENCOUNTER — Other Ambulatory Visit: Payer: Self-pay

## 2020-06-15 DIAGNOSIS — Z780 Asymptomatic menopausal state: Secondary | ICD-10-CM | POA: Diagnosis not present

## 2020-07-05 DIAGNOSIS — J019 Acute sinusitis, unspecified: Secondary | ICD-10-CM | POA: Diagnosis not present

## 2020-07-05 DIAGNOSIS — E739 Lactose intolerance, unspecified: Secondary | ICD-10-CM | POA: Diagnosis not present

## 2020-08-23 DIAGNOSIS — T50B95A Adverse effect of other viral vaccines, initial encounter: Secondary | ICD-10-CM

## 2020-08-23 HISTORY — DX: Adverse effect of other viral vaccines, initial encounter: T50.B95A

## 2020-08-24 DIAGNOSIS — D171 Benign lipomatous neoplasm of skin and subcutaneous tissue of trunk: Secondary | ICD-10-CM | POA: Diagnosis not present

## 2020-08-24 DIAGNOSIS — I491 Atrial premature depolarization: Secondary | ICD-10-CM | POA: Diagnosis not present

## 2020-08-24 DIAGNOSIS — M791 Myalgia, unspecified site: Secondary | ICD-10-CM | POA: Diagnosis not present

## 2020-08-28 ENCOUNTER — Emergency Department
Admission: EM | Admit: 2020-08-28 | Discharge: 2020-08-28 | Disposition: A | Discharge status: Home / Self Care | Payer: BC Managed Care – PPO | Attending: Emergency Medicine | Admitting: Emergency Medicine

## 2020-08-28 ENCOUNTER — Other Ambulatory Visit: Payer: Self-pay

## 2020-08-28 DIAGNOSIS — T8089XA Other complications following infusion, transfusion and therapeutic injection, initial encounter: Secondary | ICD-10-CM | POA: Diagnosis not present

## 2020-08-28 DIAGNOSIS — I10 Essential (primary) hypertension: Secondary | ICD-10-CM | POA: Insufficient documentation

## 2020-08-28 DIAGNOSIS — R52 Pain, unspecified: Secondary | ICD-10-CM

## 2020-08-28 DIAGNOSIS — M79602 Pain in left arm: Secondary | ICD-10-CM | POA: Insufficient documentation

## 2020-08-28 HISTORY — DX: Essential (primary) hypertension: I10

## 2020-08-28 LAB — COMPREHENSIVE METABOLIC PANEL
ALT: 20 U/L (ref 0–44)
AST: 28 U/L (ref 15–41)
Albumin: 4 g/dL (ref 3.5–5.0)
Alkaline Phosphatase: 72 U/L (ref 38–126)
Anion gap: 8 (ref 5–15)
BUN: 24 mg/dL — ABNORMAL HIGH (ref 8–23)
CO2: 31 mmol/L (ref 22–32)
Calcium: 10 mg/dL (ref 8.9–10.3)
Chloride: 102 mmol/L (ref 98–111)
Creatinine, Ser: 0.93 mg/dL (ref 0.44–1.00)
GFR, Estimated: >60 mL/min (ref >60)
Glucose, Bld: 98 mg/dL (ref 70–99)
Potassium: 3.4 mmol/L — ABNORMAL LOW (ref 3.5–5.1)
Sodium: 141 mmol/L (ref 135–145)
Total Bilirubin: 0.4 mg/dL (ref 0.3–1.2)
Total Protein: 7.4 g/dL (ref 6.5–8.1)

## 2020-08-28 LAB — CBC WITH DIFFERENTIAL/PLATELET
Abs Immature Granulocytes: 0 10*3/uL (ref 0.00–0.07)
Basophils Absolute: 0 10*3/uL (ref 0.0–0.1)
Basophils Relative: 1 %
Eosinophils Absolute: 0.3 10*3/uL (ref 0.0–0.5)
Eosinophils Relative: 5 %
HCT: 38.5 % (ref 36.0–46.0)
Hemoglobin: 12.8 g/dL (ref 12.0–15.0)
Immature Granulocytes: 0 %
Lymphocytes Relative: 43 %
Lymphs Abs: 2.1 10*3/uL (ref 0.7–4.0)
MCH: 31.3 pg (ref 26.0–34.0)
MCHC: 33.2 g/dL (ref 30.0–36.0)
MCV: 94.1 fL (ref 80.0–100.0)
Monocytes Absolute: 0.4 10*3/uL (ref 0.1–1.0)
Monocytes Relative: 8 %
Neutro Abs: 2.2 10*3/uL (ref 1.7–7.7)
Neutrophils Relative %: 43 %
Platelets: 303 10*3/uL (ref 150–400)
RBC: 4.09 MIL/uL (ref 3.87–5.11)
RDW: 12.7 % (ref 11.5–15.5)
WBC: 5 10*3/uL (ref 4.0–10.5)
nRBC: 0 % (ref 0.0–0.2)

## 2020-08-28 LAB — TROPONIN I (HIGH SENSITIVITY): Troponin I (High Sensitivity): 8 ng/L (ref <18)

## 2020-08-28 MED ORDER — LIDOCAINE 5 % EX PTCH
1.0000 | MEDICATED_PATCH | CUTANEOUS | Status: DC
  Administered 2020-08-28: 1 via TRANSDERMAL
  Filled 2020-08-28: qty 1

## 2020-08-28 MED ORDER — LIDOCAINE 5 % EX PTCH: 1.0000 | MEDICATED_PATCH | Freq: Two times a day (BID) | CUTANEOUS | 0 refills | Status: AC | PRN

## 2020-08-28 MED ORDER — CYCLOBENZAPRINE HCL 5 MG PO TABS: 5.0000 mg | ORAL_TABLET | Freq: Three times a day (TID) | ORAL | 0 refills | Status: DC | PRN

## 2020-08-28 MED ORDER — PREDNISONE 20 MG PO TABS
60.0000 mg | ORAL_TABLET | Freq: Once | ORAL | Status: AC
  Administered 2020-08-28: 60 mg via ORAL
  Filled 2020-08-28: qty 3

## 2020-08-28 MED ORDER — PREDNISONE 20 MG PO TABS: 40.0000 mg | ORAL_TABLET | Freq: Every day | ORAL | 0 refills | Status: AC

## 2020-08-28 NOTE — Discharge Instructions (Signed)
Your exam and EKG is reassuring. Take the prescription meds as directed. Apply ice and/or moist heat to reduce symptoms. Follow-up with your provider or return if needed.

## 2020-08-28 NOTE — ED Triage Notes (Signed)
Pt to ED reports had a 2nd covid booster over a week ago and is having soreness/pain through out arm that received injection, shoulder, arm pit, upper body.  Went to PCP Friday and had EKG performed.  Pt in NAD, RR even and unlabored . Ambulatory  Received muscle relaxer, has not taken

## 2020-08-28 NOTE — ED Provider Notes (Signed)
[facility] Emergency Department Provider Note ____________________________________________  Time seen: 0718  I have reviewed the triage vital signs and the nursing notes.  HISTORY  Chief Complaint  COVID booster rxn   HPI Sara Lara is a 65 y.o. female with a history of hypertension and a previous COVID diagnosis in September, treated with monoclonal antibodies presents to the ED for evaluation.  Patient apparently received their second booster of COVID over the weekend (July 30).  Since that time she is experienced pain and soreness to the arm where the injection was placed.  Denies any fever, chills, sweats, or frank chest pain.  She was evaluated on Friday by the PCP, and was given a prescription for muscle relaxant, which she has not started.  Past Medical History:  Diagnosis Date   Hypertension     There are no problems to display for this patient.   History reviewed. No pertinent surgical history.  Prior to Admission medications   Medication Sig Start Date End Date Taking? Authorizing Provider  cyclobenzaprine (FLEXERIL) 5 MG tablet Take 1-2 tablets (5-10 mg total) by mouth 3 (three) times daily as needed. 08/28/20  Yes Nhan Qualley, Dannielle Karvonen, PA-C  lidocaine (LIDODERM) 5 % Place 1 patch onto the skin every 12 (twelve) hours as needed for up to 10 days. Remove & Discard patch after 12 hours of wear each day. 08/28/20 09/07/20 Yes Daryon Remmert, Dannielle Karvonen, PA-C  predniSONE (DELTASONE) 20 MG tablet Take 2 tablets (40 mg total) by mouth daily with breakfast for 5 days. 08/28/20 09/02/20 Yes Shakeira Rhee, Dannielle Karvonen, PA-C  ondansetron (ZOFRAN-ODT) 4 MG disintegrating tablet Take 1 tablet (4 mg total) by mouth every 8 (eight) hours as needed for nausea or vomiting. 10/05/19   Merlyn Lot, MD    Allergies Darvon [propoxyphene], Percocet [oxycodone-acetaminophen], and Tramadol  History reviewed. No pertinent family history.  Social History Social History    Tobacco Use   Smoking status: Never   Smokeless tobacco: Never    Review of Systems  Constitutional: Negative for fever. Eyes: Negative for visual changes. ENT: Negative for sore throat. Cardiovascular: Negative for chest pain. Respiratory: Negative for shortness of breath. Gastrointestinal: Negative for abdominal pain, vomiting and diarrhea. Genitourinary: Negative for dysuria. Musculoskeletal: Negative for back pain. Skin: Negative for rash. Neurological: Negative for headaches, focal weakness or numbness. ____________________________________________  PHYSICAL EXAM:  VITAL SIGNS: ED Triage Vitals  Enc Vitals Group     BP 08/28/20 0707 (!) 174/112     Pulse Rate 08/28/20 0707 87     Resp 08/28/20 0707 16     Temp 08/28/20 0708 98.9 F (37.2 C)     Temp Source 08/28/20 0708 Oral     SpO2 08/28/20 0707 99 %     Weight 08/28/20 0710 162 lb (73.5 kg)     Height 08/28/20 0710 '5\' 4"'$  (1.626 m)     Head Circumference --      Peak Flow --      Pain Score 08/28/20 0710 8     Pain Loc --      Pain Edu? --      Excl. in [place]? --     Constitutional: Alert and oriented. Well appearing and in no distress. Head: Normocephalic and atraumatic. Eyes: Conjunctivae are normal. Normal extraocular movements Neck: Supple.  Normal range of motion. Cardiovascular: Normal rate, regular rhythm. Normal distal pulses. Respiratory: Normal respiratory effort. No wheezes/rales/rhonchi. Gastrointestinal: Soft and nontender. No distention. Musculoskeletal: Left upper extremity  without any obvious deformity, dislocation, joint effusions.  Patient is without tenderness to palpation to the upper deltoid and lower biceps region with a injection was placed.  Normal composite fist distally.  Active range of motion without signs of internal derangement of the shoulder.  Nontender with normal range of motion in all extremities.  Neurologic: Cranial nerves II to XII grossly intact.  Normal UV DTRs  bilaterally.  Normal gait without ataxia. Normal speech and language. No gross focal neurologic deficits are appreciated. Skin:  Skin is warm, dry and intact. No rash, bruising, ecchymosis, edema, induration, or fluctuance noted. Psychiatric: Mood and affect are normal. Patient exhibits appropriate insight and judgment. ____________________________________________    {LABS (pertinent positives/negatives)  Labs Reviewed  COMPREHENSIVE METABOLIC PANEL - Abnormal; Notable for the following components:      Result Value   Potassium 3.4 (*)    BUN 24 (*)    All other components within normal limits  CBC WITH DIFFERENTIAL/PLATELET  TROPONIN I (HIGH SENSITIVITY)  TROPONIN I (HIGH SENSITIVITY)  ____________________________________________  {EKG  NSR 78 bpm PR interval 116 ms QRS duration 82 ms Normal axis Sinus rhythm w/ PSVCs ____________________________________________   RADIOLOGY Official radiology report(s): No results found. ____________________________________________  PROCEDURES   Procedures ____________________________________________   INITIAL IMPRESSION / ASSESSMENT AND PLAN / ED COURSE  As part of my medical decision making, I reviewed the following data within the [place] reviewed WNL, EKG interpreted NSR, and Notes from prior ED visits      DDX: local reaction, sterile abscess, myalgias, nerve irritation  Patient ED evaluation of left-sided upper extremity muscle pain concerning for local reaction to red ill placed COVID booster 2 weeks ago.  Patient presents for evaluation of her symptoms, found to be stable overall with reassuring labs and negative troponin.  No signs of any sterile abscess or local cellulitis formation.  Symptoms are more consistent with myalgias and may be a local nerve irritation.  Patient is reassured by her work-up at this time, will be discharged with a prescription for prednisone, Lidoderm patches, cyclobenzaprine.   She will follow with primary provider for ongoing symptoms.  Return precautions have been reviewed.    [name] was evaluated in Emergency Department on 08/28/2020 for the symptoms described in the history of present illness. She was evaluated in the context of the global COVID-19 pandemic, which necessitated consideration that the patient might be at risk for infection with the SARS-CoV-2 virus that causes COVID-19. Institutional protocols and algorithms that pertain to the evaluation of patients at risk for COVID-19 are in a state of rapid change based on information released by regulatory bodies including the CDC and federal and state organizations. These policies and algorithms were followed during the patient's care in the ED. ____________________________________________  FINAL CLINICAL IMPRESSION(S) / ED DIAGNOSES  Final diagnoses:  Pain at injection site, initial encounter      Melvenia Needles, PA-C 08/28/20 1711    Duffy Bruce, MD 08/29/20 1916

## 2020-09-06 ENCOUNTER — Encounter: Payer: Self-pay | Admitting: Surgery

## 2020-09-06 ENCOUNTER — Other Ambulatory Visit: Payer: Self-pay

## 2020-09-06 ENCOUNTER — Ambulatory Visit (INDEPENDENT_AMBULATORY_CARE_PROVIDER_SITE_OTHER): Payer: BC Managed Care – PPO | Admitting: Surgery

## 2020-09-06 ENCOUNTER — Ambulatory Visit: Payer: Self-pay

## 2020-09-06 DIAGNOSIS — M4722 Other spondylosis with radiculopathy, cervical region: Secondary | ICD-10-CM | POA: Diagnosis not present

## 2020-09-06 DIAGNOSIS — M542 Cervicalgia: Secondary | ICD-10-CM

## 2020-09-06 NOTE — Progress Notes (Signed)
   Office Visit Note   Patient: Sara Lara           Date of Birth: 18-Aug-1955           MRN: [id number] Visit Date: 09/06/2020              Requested by: Lois Huxley, [place],  [place] [id number] PCP: Lois Huxley, [place]   Assessment & Plan: Visit Diagnoses:  1. Neck pain   2. Other spondylosis with radiculopathy, cervical region     Plan: Patient will follow-up in a couple weeks for recheck.  Patient has failed conservative treatment with oral prednisone.  We will try course of physical therapy.  Follow-Up Instructions: Return in about 2 weeks (around 09/20/2020) for with Almin Livingstone recheck cervical spine. .   Orders:  Orders Placed This Encounter  Procedures   XR Cervical Spine 2 or 3 views   No orders of the defined types were placed in this encounter.     Procedures: No procedures performed   Clinical Data: No additional findings.   Subjective: Chief Complaint  Patient presents with   Left Upper Arm - New Patient (Initial Visit)    HPI  Review of Systems   Objective: Vital Signs: There were no vitals taken for this visit.  Physical Exam  Ortho Exam  Specialty Comments:  No specialty comments available.  Imaging: No results found.   PMFS History: Patient Active Problem List   Diagnosis Date Noted   Paresthesia 12/06/2020   Gait abnormality 12/06/2020   Hip pain 12/06/2020   Past Medical History:  Diagnosis Date   Adverse reaction to COVID-19 vaccine 08/23/2020   PAC's after 4th covid vaccine pfizer   Allergy    seasonal   Anemia    "when had breast cancer,1991   Arthritis    back,toe,ankles   Cancer ([place])    right breast   Cataract    beginning stage   Heart murmur    Hypertension    Neuromuscular disorder (HCC)    nerve pain to LLE since neck surgery   PAC (premature atrial contraction)    Sciatic nerve disease     Family History  Problem Relation Age of Onset   Colon polyps Mother    Colon polyps  Father    Crohn's disease Child    Colon cancer Neg Hx    Esophageal cancer Neg Hx    Rectal cancer Neg Hx    Stomach cancer Neg Hx     Past Surgical History:  Procedure Laterality Date   ANTERIOR CERVICAL DISCECTOMY & FUSION SINGLE LEVEL C7-T1     january 5 th 2023   BREAST LUMPECTOMY WITH AXILLARY LYMPH NODE DISSECTION Right    BREAST RECONSTRUCTION Right    bresat surgery Left    reduction   COLONOSCOPY  07/02/2021   COLONOSCOPY     Social History   Occupational History   Not on file  Tobacco Use   Smoking status: Never    Passive exposure: Never   Smokeless tobacco: Never  Vaping Use   Vaping Use: Never used  Substance and Sexual Activity   Alcohol use: Never   Drug use: Never   Sexual activity: Not Currently    Birth control/protection: Post-menopausal

## 2020-09-12 DIAGNOSIS — M79622 Pain in left upper arm: Secondary | ICD-10-CM | POA: Diagnosis not present

## 2020-09-12 DIAGNOSIS — M546 Pain in thoracic spine: Secondary | ICD-10-CM | POA: Diagnosis not present

## 2020-09-20 ENCOUNTER — Ambulatory Visit: Payer: BC Managed Care – PPO | Admitting: Surgery

## 2020-10-15 ENCOUNTER — Encounter: Payer: Self-pay | Admitting: Cardiology

## 2020-10-15 ENCOUNTER — Other Ambulatory Visit: Payer: Self-pay

## 2020-10-15 ENCOUNTER — Ambulatory Visit (INDEPENDENT_AMBULATORY_CARE_PROVIDER_SITE_OTHER): Payer: BC Managed Care – PPO | Admitting: Cardiology

## 2020-10-15 VITALS — BP 140/86 | Ht 64.0 in | Wt 161.0 lb

## 2020-10-15 DIAGNOSIS — R011 Cardiac murmur, unspecified: Secondary | ICD-10-CM | POA: Diagnosis not present

## 2020-10-15 DIAGNOSIS — I10 Essential (primary) hypertension: Secondary | ICD-10-CM

## 2020-10-15 DIAGNOSIS — I491 Atrial premature depolarization: Secondary | ICD-10-CM

## 2020-10-15 MED ORDER — VALSARTAN 160 MG PO TABS: 160.0000 mg | ORAL_TABLET | Freq: Every day | ORAL | 3 refills | Status: DC

## 2020-10-15 MED ORDER — INDAPAMIDE 2.5 MG PO TABS: 5.0000 mg | ORAL_TABLET | Freq: Every day | ORAL | 3 refills | Status: DC

## 2020-10-15 NOTE — Patient Instructions (Signed)
Medication Instructions:   Your physician has recommended you make the following change in your medication:    STOP taking your Losartan.  2.    START taking Valsartan 160 MG once a day.  3.    Take your Indapamide 2.5 MG as two tablets (5MG ) once a day.  *If you need a refill on your cardiac medications before your next appointment, please call your pharmacy*   Lab Work: None ordered If you have labs (blood work) drawn today and your tests are completely normal, you will receive your results only by: [place] (if you have MyChart) OR A paper copy in the mail If you have any lab test that is abnormal or we need to change your treatment, we will call you to review the results.   Testing/Procedures:  Your physician has requested that you have an echocardiogram. Echocardiography is a painless test that uses sound waves to create images of your heart. It provides your doctor with information about the size and shape of your heart and how well your heart's chambers and valves are working. This procedure takes approximately one hour. There are no restrictions for this procedure.    Follow-Up: At [facility], you and your health needs are our priority.  As part of our continuing mission to provide you with exceptional heart care, we have created designated Provider Care Teams.  These Care Teams include your primary Cardiologist (physician) and Advanced Practice Providers (APPs -  Physician Assistants and Nurse Practitioners) who all work together to provide you with the care you need, when you need it.  We recommend signing up for the patient portal called "MyChart".  Sign up information is provided on this After Visit Summary.  MyChart is used to connect with patients for Virtual Visits (Telemedicine).  Patients are able to view lab/test results, encounter notes, upcoming appointments, etc.  Non-urgent messages can be sent to your provider as well.   To learn more about what you  can do with MyChart, go to [email or web address].    Your next appointment:   Follow up after your Echo   The format for your next appointment:    In Person  Provider:    Dr. Garen Lah  Other Instructions

## 2020-10-15 NOTE — Progress Notes (Signed)
Cardiology Office Note:    Date:  10/15/2020   ID:  Sara, Lara 1955/02/20, MRN [id number]  PCP:  Lois Huxley, PA   [facility] Providers Cardiologist:  None     Referring MD: Lois Huxley, PA   Chief Complaint  Patient presents with   New Patient (Initial Visit)    Referred by PCP for PACs. Meds reviewed verbally with patient    Sara Lara is a 65 y.o. female who is being seen today for the evaluation of PVCs at the request of Lois Huxley, [place].   History of Present Illness:    Sara Lara is a 65 y.o. female with a hx of hypertension who presents due to PACs.  Patient had second COVID-vaccine July 2022 leading to left arm pain and myalgias.  Presented to the ED, EKG showed PACs.  She was recommended to follow-up with cardiology.  Denies any history of heart disease.  Has been on blood pressure medication over 20 years now.  Previously was on indapamide 5 mg daily only.  Losartan was recently started and titrated to 100 mg daily.  Blood pressures at home were 450 systolic.  Denies dizziness, palpitations, edema.  States having chest pain/muscle pain when she works out which typically improves couple of days after.  Past Medical History:  Diagnosis Date   Hypertension     History reviewed. No pertinent surgical history.  Current Medications: Current Meds  Medication Sig   acetaminophen (TYLENOL) 325 MG tablet Take 325 mg by mouth every 4 (four) hours as needed.   Calcium Carbonate (CALCIUM 600 PO) Take 600 mg by mouth 2 (two) times daily.   Cholecalciferol (VITAMIN D3) LIQD Take 5 drops by mouth daily.   cyclobenzaprine (FLEXERIL) 10 MG tablet Take 10 mg by mouth 3 (three) times daily as needed for muscle spasms.   diphenhydrAMINE (BENADRYL) 25 MG tablet Take 25 mg by mouth at bedtime as needed.   fluticasone (FLONASE) 50 MCG/ACT nasal spray Place into both nostrils daily.   indapamide (LOZOL) 2.5 MG tablet Take 2 tablets (5 mg total) by mouth daily.    MAGNESIUM PO Take by mouth daily.   Melatonin 10 MG TABS Take 1 tablet by mouth at bedtime as needed.   Multiple Vitamin (MULTIVITAMIN ADULT PO) Take 1 tablet by mouth daily.   POTASSIUM CHLORIDE PO Take 20 mEq by mouth daily.   predniSONE (DELTASONE) 50 MG tablet Take 50 mg by mouth daily with breakfast.   valsartan (DIOVAN) 160 MG tablet Take 1 tablet (160 mg total) by mouth daily.   [DISCONTINUED] INDAPAMIDE PO Take 5 mg by mouth daily.   [DISCONTINUED] valsartan (DIOVAN) 160 MG tablet Take 160 mg by mouth daily.     Allergies:   Darvon [propoxyphene], Percocet [oxycodone-acetaminophen], and Tramadol   Social History   Socioeconomic History   Marital status: Divorced    Spouse name: Not on file   Number of children: Not on file   Years of education: Not on file   Highest education level: Not on file  Occupational History   Not on file  Tobacco Use   Smoking status: Never   Smokeless tobacco: Never  Substance and Sexual Activity   Alcohol use: Not on file   Drug use: Not on file   Sexual activity: Not on file  Other Topics Concern   Not on file  Social History Narrative   Not on file   Social Determinants of Health  Financial Resource Strain: Not on file  Food Insecurity: Not on file  Transportation Needs: Not on file  Physical Activity: Not on file  Stress: Not on file  Social Connections: Not on file     Family History: The patient's family history is not on file.  ROS:   Please see the history of present illness.     All other systems reviewed and are negative.  EKGs/Labs/Other Studies Reviewed:    The following studies were reviewed today:  EKG:  EKG is  ordered today.  The ekg ordered today demonstrates normal sinus rhythm, PACs.  Recent Labs: 08/28/2020: ALT 20; BUN 24; Creatinine, Ser 0.93; Hemoglobin 12.8; Platelets 303; Potassium 3.4; Sodium 141  Recent Lipid Panel No results found for: CHOL, TRIG, HDL, CHOLHDL, VLDL, LDLCALC, LDLDIRECT   Risk  Assessment/Calculations:          Physical Exam:    VS:  BP 140/86 (BP Location: Left Arm, Patient Position: Sitting, Cuff Size: Normal)   Ht 5\' 4"  (1.626 m)   Wt 161 lb (73 kg)   BMI 27.64 kg/m     Wt Readings from Last 3 Encounters:  10/15/20 161 lb (73 kg)  08/28/20 162 lb (73.5 kg)  10/05/19 160 lb (72.6 kg)     GEN:  Well nourished, well developed in no acute distress HEENT: Normal NECK: No JVD; No carotid bruits LYMPHATICS: No lymphadenopathy CARDIAC: RRR, 2/6 systolic murmur RESPIRATORY:  Clear to auscultation without rales, wheezing or rhonchi  ABDOMEN: Soft, non-tender, non-distended MUSCULOSKELETAL:  No edema; No deformity  SKIN: Warm and dry NEUROLOGIC:  Alert and oriented x 3 PSYCHIATRIC:  Normal affect   ASSESSMENT:    1. Primary hypertension   2. Systolic murmur   3. PAC (premature atrial contraction)    PLAN:    In order of problems listed above:  Hypertension, BP still elevated.  Consolidate indapamide to 5 mg 1 tab daily.  Start valsartan 160 mg daily, stop losartan.  Plan to titrate valsartan at follow-up visit if BP not controlled. Systolic murmur on exam, get echocardiogram to evaluate any significant valvular pathology. PACs, patient asymptomatic, educated on benign nature of findings.  Follow-up after echo.     Medication Adjustments/Labs and Tests Ordered: Current medicines are reviewed at length with the patient today.  Concerns regarding medicines are outlined above.  Orders Placed This Encounter  Procedures   ECHOCARDIOGRAM COMPLETE    Meds ordered this encounter  Medications   valsartan (DIOVAN) 160 MG tablet    Sig: Take 1 tablet (160 mg total) by mouth daily.    Dispense:  30 tablet    Refill:  3   indapamide (LOZOL) 2.5 MG tablet    Sig: Take 2 tablets (5 mg total) by mouth daily.    Dispense:  30 tablet    Refill:  3     Patient Instructions  Medication Instructions:   Your physician has recommended you make the  following change in your medication:    STOP taking your Losartan.  2.    START taking Valsartan 160 MG once a day.  3.    Take your Indapamide 2.5 MG as two tablets (5MG ) once a day.  *If you need a refill on your cardiac medications before your next appointment, please call your pharmacy*   Lab Work: None ordered If you have labs (blood work) drawn today and your tests are completely normal, you will receive your results only by: [place] (if you have MyChart)  OR A paper copy in the mail If you have any lab test that is abnormal or we need to change your treatment, we will call you to review the results.   Testing/Procedures:  Your physician has requested that you have an echocardiogram. Echocardiography is a painless test that uses sound waves to create images of your heart. It provides your doctor with information about the size and shape of your heart and how well your heart's chambers and valves are working. This procedure takes approximately one hour. There are no restrictions for this procedure.    Follow-Up: At [facility], you and your health needs are our priority.  As part of our continuing mission to provide you with exceptional heart care, we have created designated Provider Care Teams.  These Care Teams include your primary Cardiologist (physician) and Advanced Practice Providers (APPs -  Physician Assistants and Nurse Practitioners) who all work together to provide you with the care you need, when you need it.  We recommend signing up for the patient portal called "MyChart".  Sign up information is provided on this After Visit Summary.  MyChart is used to connect with patients for Virtual Visits (Telemedicine).  Patients are able to view lab/test results, encounter notes, upcoming appointments, etc.  Non-urgent messages can be sent to your provider as well.   To learn more about what you can do with MyChart, go to [email or web address].    Your next  appointment:   Follow up after your Echo   The format for your next appointment:    In Person  Provider:    Dr. Garen Lah  Other Instructions    Signed, Kate Sable, MD  10/15/2020 5:07 PM    [place]

## 2020-10-18 NOTE — Addendum Note (Signed)
Addended by: Janan Ridge on: 10/18/2020 11:32 AM   Modules accepted: Orders

## 2020-11-14 DIAGNOSIS — M5416 Radiculopathy, lumbar region: Secondary | ICD-10-CM | POA: Diagnosis not present

## 2020-11-14 DIAGNOSIS — R202 Paresthesia of skin: Secondary | ICD-10-CM | POA: Diagnosis not present

## 2020-11-15 ENCOUNTER — Other Ambulatory Visit: Payer: Self-pay | Admitting: Family Medicine

## 2020-11-15 DIAGNOSIS — M5416 Radiculopathy, lumbar region: Secondary | ICD-10-CM

## 2020-11-20 ENCOUNTER — Ambulatory Visit (INDEPENDENT_AMBULATORY_CARE_PROVIDER_SITE_OTHER): Payer: BC Managed Care – PPO

## 2020-11-20 ENCOUNTER — Other Ambulatory Visit: Payer: Self-pay

## 2020-11-20 DIAGNOSIS — R011 Cardiac murmur, unspecified: Secondary | ICD-10-CM | POA: Diagnosis not present

## 2020-11-20 LAB — ECHOCARDIOGRAM COMPLETE
AR max vel: 1.96 cm2
AV Area VTI: 2.07 cm2
AV Area mean vel: 1.98 cm2
AV Mean grad: 5 mmHg
AV Peak grad: 11.3 mmHg
Ao pk vel: 1.68 m/s
Area-P 1/2: 3.4 cm2
Calc EF: 64.5 %
S' Lateral: 2.5 cm
Single Plane A2C EF: 71.8 %
Single Plane A4C EF: 56.3 %

## 2020-11-23 ENCOUNTER — Ambulatory Visit (INDEPENDENT_AMBULATORY_CARE_PROVIDER_SITE_OTHER): Payer: BC Managed Care – PPO | Admitting: Cardiology

## 2020-11-23 ENCOUNTER — Other Ambulatory Visit: Payer: Self-pay

## 2020-11-23 ENCOUNTER — Encounter: Payer: Self-pay | Admitting: Cardiology

## 2020-11-23 VITALS — BP 140/100 | HR 73 | Ht 64.0 in | Wt 163.0 lb

## 2020-11-23 DIAGNOSIS — R011 Cardiac murmur, unspecified: Secondary | ICD-10-CM | POA: Diagnosis not present

## 2020-11-23 DIAGNOSIS — I10 Essential (primary) hypertension: Secondary | ICD-10-CM

## 2020-11-23 MED ORDER — VALSARTAN 160 MG PO TABS: 160.0000 mg | ORAL_TABLET | Freq: Two times a day (BID) | ORAL | 1 refills | Status: DC

## 2020-11-23 NOTE — Progress Notes (Signed)
Cardiology Office Note:    Date:  11/23/2020   ID:  Sara, Lara [birth year], MRN [id number]  PCP:  Lois Huxley, PA   [facility] Providers Cardiologist:  None     Referring MD: Lois Huxley, PA   Chief Complaint  Patient presents with   Other    Follow up post ECHO -- Meds reviewed verbally with patient.     History of Present Illness:    Sara Lara is a 65 y.o. female with a hx of hypertension, chronic back pain who presents for follow-up.  Previously seen due to hypertension.  Systolic normal noted on cardiac exam.  Echocardiogram was ordered.  She now presents for results.   Developed low back pain after lifting some furniture years ago.  Seem to have worsened after receiving COVID-vaccine in July.  Also complaining of left arm pain, left leg pain causing difficulty with walking.  Valsartan was started after last visit.  Systolic blood pressures have stayed about the same in the 140s.  Plans to follow-up with primary care provider regarding musculoskeletal/back pain and left shoulder pain.   Past Medical History:  Diagnosis Date   Hypertension     History reviewed. No pertinent surgical history.  Current Medications: Current Meds  Medication Sig   Calcium Carbonate (CALCIUM 600 PO) Take 600 mg by mouth 2 (two) times daily.   Cholecalciferol (VITAMIN D3) LIQD Take 5 drops by mouth daily.   fluticasone (FLONASE) 50 MCG/ACT nasal spray Place into both nostrils daily.   indapamide (LOZOL) 2.5 MG tablet Take 2 tablets (5 mg total) by mouth daily.   MAGNESIUM PO Take by mouth daily.   Melatonin 10 MG TABS Take 1 tablet by mouth at bedtime as needed.   Multiple Vitamin (MULTIVITAMIN ADULT PO) Take 1 tablet by mouth daily.   POTASSIUM CHLORIDE PO Take 20 mEq by mouth daily.   predniSONE (DELTASONE) 50 MG tablet Take 50 mg by mouth daily with breakfast.   [DISCONTINUED] valsartan (DIOVAN) 160 MG tablet Take 1 tablet (160 mg total) by mouth daily.     Allergies:    Darvon [propoxyphene], Percocet [oxycodone-acetaminophen], and Tramadol   Social History   Socioeconomic History   Marital status: Divorced    Spouse name: Not on file   Number of children: Not on file   Years of education: Not on file   Highest education level: Not on file  Occupational History   Not on file  Tobacco Use   Smoking status: Never   Smokeless tobacco: Never  Substance and Sexual Activity   Alcohol use: Not on file   Drug use: Not on file   Sexual activity: Not on file  Other Topics Concern   Not on file  Social History Narrative   Not on file   Social Determinants of Health   Financial Resource Strain: Not on file  Food Insecurity: Not on file  Transportation Needs: Not on file  Physical Activity: Not on file  Stress: Not on file  Social Connections: Not on file     Family History: The patient's family history is not on file.  ROS:   Please see the history of present illness.     All other systems reviewed and are negative.  EKGs/Labs/Other Studies Reviewed:    The following studies were reviewed today:  EKG:  EKG not ordered today.   Recent Labs: 08/28/2020: ALT 20; BUN 24; Creatinine, Ser 0.93; Hemoglobin 12.8; Platelets 303; Potassium 3.4; Sodium  141  Recent Lipid Panel No results found for: CHOL, TRIG, HDL, CHOLHDL, VLDL, LDLCALC, LDLDIRECT   Risk Assessment/Calculations:          Physical Exam:    VS:  BP (!) 140/100 (BP Location: Left Arm, Patient Position: Sitting, Cuff Size: Normal)   Pulse 73   Ht 5\' 4"  (1.626 m)   Wt 163 lb (73.9 kg)   SpO2 97%   BMI 27.98 kg/m     Wt Readings from Last 3 Encounters:  11/23/20 163 lb (73.9 kg)  10/15/20 161 lb (73 kg)  08/28/20 162 lb (73.5 kg)     GEN:  Well nourished, well developed in no acute distress HEENT: Normal NECK: No JVD; No carotid bruits LYMPHATICS: No lymphadenopathy CARDIAC: RRR, 2/6 systolic murmur RESPIRATORY:  Clear to auscultation without rales, wheezing or  rhonchi  ABDOMEN: Soft, non-tender, non-distended MUSCULOSKELETAL:  No edema; No deformity  SKIN: Warm and dry NEUROLOGIC:  Alert and oriented x 3 PSYCHIATRIC:  Normal affect   ASSESSMENT:    1. Primary hypertension   2. Systolic murmur     PLAN:    In order of problems listed above:  Hypertension, BP still elevated.  Continue indapamide to 5 mg 1 tab daily.  Increase valsartan to 160 mg twice daily.  Chronic musculoskeletal pain possibly contributing to elevated BP Systolic murmur on exam, echocardiogram shows normal systolic function EF 60 to 65%, impaired relaxation, mild MR.  No significant valvular abnormalities.  Follow-up in 3 months.     Medication Adjustments/Labs and Tests Ordered: Current medicines are reviewed at length with the patient today.  Concerns regarding medicines are outlined above.  No orders of the defined types were placed in this encounter.   Meds ordered this encounter  Medications   valsartan (DIOVAN) 160 MG tablet    Sig: Take 1 tablet (160 mg total) by mouth 2 (two) times daily.    Dispense:  180 tablet    Refill:  1      Patient Instructions  Medication Instructions:   Your physician has recommended you make the following change in your medication:   INCREASE your Valsartan (Diovan) to 160 MG twice a day.   *If you need a refill on your cardiac medications before your next appointment, please call your pharmacy*   Lab Work: None ordered If you have labs (blood work) drawn today and your tests are completely normal, you will receive your results only by: [place] (if you have MyChart) OR A paper copy in the mail If you have any lab test that is abnormal or we need to change your treatment, we will call you to review the results.   Testing/Procedures: None ordered   Follow-Up: At [facility], you and your health needs are our priority.  As part of our continuing mission to provide you with exceptional heart care, we  have created designated Provider Care Teams.  These Care Teams include your primary Cardiologist (physician) and Advanced Practice Providers (APPs -  Physician Assistants and Nurse Practitioners) who all work together to provide you with the care you need, when you need it.  We recommend signing up for the patient portal called "MyChart".  Sign up information is provided on this After Visit Summary.  MyChart is used to connect with patients for Virtual Visits (Telemedicine).  Patients are able to view lab/test results, encounter notes, upcoming appointments, etc.  Non-urgent messages can be sent to your provider as well.   To learn more  about what you can do with MyChart, go to [email or web address].    Your next appointment:   3 month(s)  The format for your next appointment:   In Person  Provider:   You may see Dr. Garen Lah or one of the following Advanced Practice Providers on your designated Care Team:   Murray Hodgkins, NP Christell Faith, PA-C Cadence Kathlen Mody, [place]    Other Instructions    Signed, Kate Sable, MD  11/23/2020 5:07 PM    [place]

## 2020-11-23 NOTE — Patient Instructions (Signed)
Medication Instructions:   Your physician has recommended you make the following change in your medication:   INCREASE your Valsartan (Diovan) to 160 MG twice a day.   *If you need a refill on your cardiac medications before your next appointment, please call your pharmacy*   Lab Work: None ordered If you have labs (blood work) drawn today and your tests are completely normal, you will receive your results only by: [place] (if you have MyChart) OR A paper copy in the mail If you have any lab test that is abnormal or we need to change your treatment, we will call you to review the results.   Testing/Procedures: None ordered   Follow-Up: At [facility], you and your health needs are our priority.  As part of our continuing mission to provide you with exceptional heart care, we have created designated Provider Care Teams.  These Care Teams include your primary Cardiologist (physician) and Advanced Practice Providers (APPs -  Physician Assistants and Nurse Practitioners) who all work together to provide you with the care you need, when you need it.  We recommend signing up for the patient portal called "MyChart".  Sign up information is provided on this After Visit Summary.  MyChart is used to connect with patients for Virtual Visits (Telemedicine).  Patients are able to view lab/test results, encounter notes, upcoming appointments, etc.  Non-urgent messages can be sent to your provider as well.   To learn more about what you can do with MyChart, go to [email or web address].    Your next appointment:   3 month(s)  The format for your next appointment:   In Person  Provider:   You may see Dr. Garen Lah or one of the following Advanced Practice Providers on your designated Care Team:   Murray Hodgkins, NP Christell Faith, PA-C Cadence Kathlen Mody, [place]    Other Instructions

## 2020-11-29 ENCOUNTER — Ambulatory Visit
Admission: RE | Admit: 2020-11-29 | Discharge: 2020-11-29 | Disposition: A | Discharge status: Home / Self Care | Payer: BC Managed Care – PPO | Source: Ambulatory Visit | Attending: Family Medicine | Admitting: Family Medicine

## 2020-11-29 ENCOUNTER — Other Ambulatory Visit: Payer: Self-pay

## 2020-11-29 DIAGNOSIS — M48061 Spinal stenosis, lumbar region without neurogenic claudication: Secondary | ICD-10-CM | POA: Diagnosis not present

## 2020-11-29 DIAGNOSIS — M545 Low back pain, unspecified: Secondary | ICD-10-CM | POA: Diagnosis not present

## 2020-11-29 DIAGNOSIS — M5416 Radiculopathy, lumbar region: Secondary | ICD-10-CM

## 2020-12-06 ENCOUNTER — Ambulatory Visit
Admission: RE | Admit: 2020-12-06 | Discharge: 2020-12-06 | Disposition: A | Discharge status: Home / Self Care | Payer: BC Managed Care – PPO | Source: Ambulatory Visit | Attending: Neurology | Admitting: Neurology

## 2020-12-06 ENCOUNTER — Ambulatory Visit (INDEPENDENT_AMBULATORY_CARE_PROVIDER_SITE_OTHER): Payer: BC Managed Care – PPO | Admitting: Neurology

## 2020-12-06 ENCOUNTER — Telehealth: Payer: Self-pay | Admitting: Neurology

## 2020-12-06 ENCOUNTER — Other Ambulatory Visit: Payer: Self-pay

## 2020-12-06 ENCOUNTER — Encounter: Payer: Self-pay | Admitting: Neurology

## 2020-12-06 VITALS — BP 155/87 | HR 75 | Ht 64.0 in | Wt 163.0 lb

## 2020-12-06 DIAGNOSIS — R269 Unspecified abnormalities of gait and mobility: Secondary | ICD-10-CM

## 2020-12-06 DIAGNOSIS — M25559 Pain in unspecified hip: Secondary | ICD-10-CM

## 2020-12-06 DIAGNOSIS — R202 Paresthesia of skin: Secondary | ICD-10-CM

## 2020-12-06 MED ORDER — GABAPENTIN 300 MG PO CAPS: 600.0000 mg | ORAL_CAPSULE | Freq: Three times a day (TID) | ORAL | 6 refills | Status: DC

## 2020-12-06 NOTE — Telephone Encounter (Signed)
Patient is aware that she will need to be worked in this week and out office will call her.

## 2020-12-06 NOTE — Patient Instructions (Addendum)
   DG HIPS BILAT WITH PELVIS 3-4 VIEWS     [facility]    Address: [address], [place], [place] [address]  Phone: [phone]   Meds ordered this encounter  Medications   gabapentin (NEURONTIN) 300 MG capsule    Sig: Take 2 capsules (600 mg total) by mouth 3 (three) times daily.    Dispense:  180 capsule    Refill:  6

## 2020-12-06 NOTE — Progress Notes (Signed)
Chief Complaint  Patient presents with   New Patient (Initial Visit)    NP/[facility] PA [phone]/paresthesias, lumbar radiculopathy Today c/o pain, numbness, tingling and balance issues in both legs, pain and tingling in both arms as well       ASSESSMENT AND PLAN  Sara Lara is a 65 y.o. female   Subacute onset of bilateral arm and leg paresthesia, gait abnormality,  Hyperreflexia on examination, stiff, scissors gait, Babinski signs, peroneal area sensory loss,  Localized to cervical or thoracic region,  MRI cervical, thoracic spine with without contrast  Laboratory evaluation including B12, TSH to rule out metabolic etiology  Return to clinic in 4 weeks  Bilateral hip pain  X-ray of bilateral hip to rule out hip pathology.    DIAGNOSTIC DATA (LABS, IMAGING, TESTING) - I reviewed patient records, labs, notes, testing and imaging myself where available. Laboratory evaluation in October 2022, WBC 3.9 hemoglobin of 12.2, B12 759 RPR was nonreactive normal ESR  MEDICAL HISTORY:  Sara Lara, is a 65 year old female, seen in request by her primary care PA Lois Huxley, for evaluation of gait abnormality, bilateral arm and leg paresthesia, she is accompanied by her husband at today's visit December 06, 2020   I reviewed and summarized the referring note. PMHX. HTN Right Breast cancer, 1991, surgery, chemo, radiation;   She contributed to her symptoms 2 days after her second [place] vaccination on August 18, 2020, 18 September 2020, she began to notice numb tingling down her left arm, deep achy pain, discomfort, later progressed to involving right arm, right hands, and few weeks later, she began to notice numbness tingling involving bilateral lower extremity, at the same time began to notice gait abnormality, fell few times, also worsening constipation, urinary frequency, urgency,  Her symptoms continue to progress, she also began to have left  perineal area numbness recently.  He noticed worsening bilateral hip pain, radiating towards inner thigh,  PHYSICAL EXAM:   Vitals:   12/06/20 0901  BP: (!) 155/87  Pulse: 75  Weight: 163 lb (73.9 kg)  Height: _0  (1.626 m)   Not recorded     Body mass index is 27.98 kg/m.  PHYSICAL EXAMNIATION:  Gen: NAD, conversant, well nourised, well groomed                     Cardiovascular: Regular rate rhythm, no peripheral edema, warm, nontender. Eyes: Conjunctivae clear without exudates or hemorrhage Neck: Supple, no carotid bruits. Pulmonary: Clear to auscultation bilaterally   NEUROLOGICAL EXAM:  MENTAL STATUS: Speech:    Speech is normal; fluent and spontaneous with normal comprehension.  Cognition:     Orientation to time, place and person     Normal recent and remote memory     Normal Attention span and concentration     Normal Language, naming, repeating,spontaneous speech     Fund of knowledge   CRANIAL NERVES: CN II: Visual fields are full to confrontation. Pupils are round equal and briskly reactive to light. CN III, IV, VI: extraocular movement are normal. No ptosis. CN V: Facial sensation is intact to light touch CN VII: Face is symmetric with normal eye closure  CN VIII: Hearing is normal to causal conversation. CN IX, X: Phonation is normal. CN XI: Head turning and shoulder shrug are intact  MOTOR: Mild bilateral shoulder abduction, hip flexion weakness  REFLEXES: Reflexes are 3 and symmetric at the biceps, triceps, knees, and ankles. Plantar  responses are extensor bilaterally  SENSORY: Intact to light touch, pinprick and vibratory sensation are intact in fingers and toes.  COORDINATION: There is no trunk or limb dysmetria noted.  GAIT/STANCE: She needs push-up to get up from seated position, stiff, scissors gait, mildly unsteady  REVIEW OF SYSTEMS:  Full 14 system review of systems performed and notable only for as above All other review of  systems were negative.   ALLERGIES: Allergies  Allergen Reactions   Darvon [Propoxyphene] Nausea And Vomiting   Percocet [Oxycodone-Acetaminophen] Nausea And Vomiting   Tramadol Nausea And Vomiting    HOME MEDICATIONS: Current Outpatient Medications  Medication Sig Dispense Refill   Calcium Carbonate (CALCIUM 600 PO) Take 600 mg by mouth 2 (two) times daily.     fluticasone (FLONASE) 50 MCG/ACT nasal spray Place into both nostrils daily.     gabapentin (NEURONTIN) 100 MG capsule Take 100 mg by mouth 3 (three) times daily.     indapamide (LOZOL) 2.5 MG tablet Take 2 tablets (5 mg total) by mouth daily. 30 tablet 3   MAGNESIUM PO Take by mouth daily.     Melatonin 10 MG TABS Take 1 tablet by mouth at bedtime as needed.     Multiple Vitamin (MULTIVITAMIN ADULT PO) Take 1 tablet by mouth daily.     POTASSIUM CHLORIDE PO Take 20 mEq by mouth daily.     valsartan (DIOVAN) 160 MG tablet Take 1 tablet (160 mg total) by mouth 2 (two) times daily. 180 tablet 1   No current facility-administered medications for this visit.    PAST MEDICAL HISTORY: Past Medical History:  Diagnosis Date   Hypertension     PAST SURGICAL HISTORY: No past surgical history on file.  FAMILY HISTORY: No family history on file.  SOCIAL HISTORY: Social History   Socioeconomic History   Marital status: Divorced    Spouse name: Not on file   Number of children: Not on file   Years of education: Not on file   Highest education level: Not on file  Occupational History   Not on file  Tobacco Use   Smoking status: Never   Smokeless tobacco: Never  Substance and Sexual Activity   Alcohol use: Not on file   Drug use: Not on file   Sexual activity: Not on file  Other Topics Concern   Not on file  Social History Narrative   Not on file   Social Determinants of Health   Financial Resource Strain: Not on file  Food Insecurity: Not on file  Transportation Needs: Not on file  Physical Activity: Not on  file  Stress: Not on file  Social Connections: Not on file  Intimate Partner Violence: Not on file      Sara Lara, M.D. Ph.D.  [facility] [address], [place], [place] [address] Ph: [phone] Fax: [phone]  CC:  Lois Huxley, [place] [place],  [place] [address]  Lois Huxley, [place]

## 2020-12-06 NOTE — Telephone Encounter (Signed)
She has been added to the schedule on 01/01/21 at 4pm (check-in time 3:30). I left patient a message with the appt information. It will also be in mychart. Provided our number to call back if needed.

## 2020-12-10 LAB — MULTIPLE MYELOMA PANEL, SERUM
Albumin SerPl Elph-Mcnc: 4.3 g/dL (ref 2.9–4.4)
Albumin/Glob SerPl: 1.3 (ref 0.7–1.7)
Alpha 1: 0.3 g/dL (ref 0.0–0.4)
Alpha2 Glob SerPl Elph-Mcnc: 0.6 g/dL (ref 0.4–1.0)
B-Globulin SerPl Elph-Mcnc: 1.2 g/dL (ref 0.7–1.3)
Gamma Glob SerPl Elph-Mcnc: 1.4 g/dL (ref 0.4–1.8)
Globulin, Total: 3.5 g/dL (ref 2.2–3.9)
IgA/Immunoglobulin A, Serum: 219 mg/dL (ref 87–352)
IgG (Immunoglobin G), Serum: 1516 mg/dL (ref 586–1602)
IgM (Immunoglobulin M), Srm: 81 mg/dL (ref 26–217)
Total Protein: 7.8 g/dL (ref 6.0–8.5)

## 2020-12-10 LAB — VITAMIN D 25 HYDROXY (VIT D DEFICIENCY, FRACTURES): Vit D, 25-Hydroxy: 89.6 ng/mL (ref 30.0–100.0)

## 2020-12-10 LAB — COPPER, SERUM: Copper: 149 ug/dL (ref 80–158)

## 2020-12-10 LAB — HIV ANTIBODY (ROUTINE TESTING W REFLEX): HIV Screen 4th Generation wRfx: NONREACTIVE

## 2020-12-10 LAB — FOLATE: Folate: 14.1 ng/mL (ref >3.0)

## 2020-12-10 LAB — CK: Total CK: 229 U/L — ABNORMAL HIGH (ref 32–182)

## 2020-12-10 LAB — HGB A1C W/O EAG: Hgb A1c MFr Bld: 5.8 % — ABNORMAL HIGH (ref 4.8–5.6)

## 2020-12-10 LAB — VITAMIN B12: Vitamin B-12: 1235 pg/mL (ref 232–1245)

## 2020-12-10 LAB — ANA W/REFLEX IF POSITIVE: Anti Nuclear Antibody (ANA): NEGATIVE

## 2020-12-10 LAB — C-REACTIVE PROTEIN: CRP: 1 mg/L (ref 0–10)

## 2020-12-10 LAB — TSH: TSH: 3.1 u[IU]/mL (ref 0.450–4.500)

## 2020-12-18 ENCOUNTER — Telehealth: Payer: Self-pay | Admitting: Neurology

## 2020-12-18 DIAGNOSIS — M25559 Pain in unspecified hip: Secondary | ICD-10-CM

## 2020-12-18 DIAGNOSIS — R202 Paresthesia of skin: Secondary | ICD-10-CM

## 2020-12-18 DIAGNOSIS — R269 Unspecified abnormalities of gait and mobility: Secondary | ICD-10-CM

## 2020-12-18 NOTE — Telephone Encounter (Signed)
Noted, thank you. I spoke with the patient she is scheduled at [facility] for 12/26/20.

## 2020-12-18 NOTE — Telephone Encounter (Signed)
I called patient, she really explained her symptoms are mainly at lower extremity, gait abnormality  MRI of lumbar showed mild degenerative changes, there is no significant canal or foraminal stenosis to explain her complaints, x-ray of bilateral hips showed no significant pathology  She did have spastic gait, hyperreflexia, we do need to complete MRI of cervical and thoracic spine  She is willing to proceed with the test, please call her for scheduling

## 2020-12-18 NOTE — Telephone Encounter (Signed)
[employer] auth: for Cervical and Thoracic: [id number] (exp. 12/18/20 to 02/15/21)  I spoke with the patient to try to schedule her MRI's she stated that her pain is not coming from her neck or her back. It is coming from her hip, buttocks and her upper thigh. She stated this all started when she got the booster covid shot. She also stated when she tries to scratch her skin she can't even feel it because it is so numb. She states no one is listening to her and she needs someone to listen to her.

## 2020-12-26 ENCOUNTER — Other Ambulatory Visit: Payer: BC Managed Care – PPO

## 2020-12-27 NOTE — Addendum Note (Signed)
Addended by: Marcial Pacas on: 12/27/2020 10:08 AM   Modules accepted: Orders

## 2020-12-27 NOTE — Telephone Encounter (Signed)
Orders Placed This Encounter  Procedures   MR CERVICAL SPINE W WO CONTRAST   MR THORACIC SPINE W [place]

## 2020-12-27 NOTE — Telephone Encounter (Signed)
Left a voicemail for patient to call back to r/s her MRI from yesterday power outage..when you get a chance can you put a new order in for me to be able to schedule off of. Thank you.

## 2020-12-30 DIAGNOSIS — M5000 Cervical disc disorder with myelopathy, unspecified cervical region: Secondary | ICD-10-CM | POA: Diagnosis not present

## 2020-12-30 DIAGNOSIS — R202 Paresthesia of skin: Secondary | ICD-10-CM | POA: Diagnosis not present

## 2020-12-30 DIAGNOSIS — R52 Pain, unspecified: Secondary | ICD-10-CM | POA: Diagnosis not present

## 2020-12-30 DIAGNOSIS — R269 Unspecified abnormalities of gait and mobility: Secondary | ICD-10-CM | POA: Diagnosis not present

## 2020-12-30 DIAGNOSIS — M4802 Spinal stenosis, cervical region: Secondary | ICD-10-CM | POA: Diagnosis not present

## 2020-12-30 DIAGNOSIS — R531 Weakness: Secondary | ICD-10-CM | POA: Diagnosis not present

## 2020-12-31 DIAGNOSIS — M9973 Connective tissue and disc stenosis of intervertebral foramina of lumbar region: Secondary | ICD-10-CM | POA: Diagnosis not present

## 2020-12-31 DIAGNOSIS — M4802 Spinal stenosis, cervical region: Secondary | ICD-10-CM | POA: Diagnosis not present

## 2020-12-31 DIAGNOSIS — M4804 Spinal stenosis, thoracic region: Secondary | ICD-10-CM | POA: Diagnosis not present

## 2020-12-31 NOTE — Telephone Encounter (Signed)
Noted, thank you

## 2021-01-01 ENCOUNTER — Ambulatory Visit: Payer: BC Managed Care – PPO | Admitting: Neurology

## 2021-01-02 DIAGNOSIS — R9389 Abnormal findings on diagnostic imaging of other specified body structures: Secondary | ICD-10-CM | POA: Diagnosis not present

## 2021-01-02 DIAGNOSIS — N83209 Unspecified ovarian cyst, unspecified side: Secondary | ICD-10-CM | POA: Diagnosis not present

## 2021-01-02 DIAGNOSIS — R1031 Right lower quadrant pain: Secondary | ICD-10-CM | POA: Diagnosis not present

## 2021-01-02 DIAGNOSIS — I491 Atrial premature depolarization: Secondary | ICD-10-CM | POA: Diagnosis not present

## 2021-01-02 DIAGNOSIS — I499 Cardiac arrhythmia, unspecified: Secondary | ICD-10-CM | POA: Diagnosis not present

## 2021-01-08 DIAGNOSIS — M4803 Spinal stenosis, cervicothoracic region: Secondary | ICD-10-CM | POA: Diagnosis not present

## 2021-01-08 DIAGNOSIS — S14157A Other incomplete lesion at C7 level of cervical spinal cord, initial encounter: Secondary | ICD-10-CM | POA: Diagnosis not present

## 2021-01-08 DIAGNOSIS — M4713 Other spondylosis with myelopathy, cervicothoracic region: Secondary | ICD-10-CM | POA: Diagnosis not present

## 2021-01-08 DIAGNOSIS — M5003 Cervical disc disorder with myelopathy, cervicothoracic region: Secondary | ICD-10-CM | POA: Diagnosis not present

## 2021-01-11 DIAGNOSIS — R9389 Abnormal findings on diagnostic imaging of other specified body structures: Secondary | ICD-10-CM | POA: Diagnosis not present

## 2021-01-16 DIAGNOSIS — I498 Other specified cardiac arrhythmias: Secondary | ICD-10-CM | POA: Diagnosis not present

## 2021-01-16 DIAGNOSIS — Z01812 Encounter for preprocedural laboratory examination: Secondary | ICD-10-CM | POA: Diagnosis not present

## 2021-01-16 DIAGNOSIS — Z01818 Encounter for other preprocedural examination: Secondary | ICD-10-CM | POA: Diagnosis not present

## 2021-01-16 DIAGNOSIS — I252 Old myocardial infarction: Secondary | ICD-10-CM | POA: Diagnosis not present

## 2021-01-16 DIAGNOSIS — Z01810 Encounter for preprocedural cardiovascular examination: Secondary | ICD-10-CM | POA: Diagnosis not present

## 2021-01-16 DIAGNOSIS — M4803 Spinal stenosis, cervicothoracic region: Secondary | ICD-10-CM | POA: Diagnosis not present

## 2021-01-17 DIAGNOSIS — Z01810 Encounter for preprocedural cardiovascular examination: Secondary | ICD-10-CM | POA: Diagnosis not present

## 2021-01-17 DIAGNOSIS — I498 Other specified cardiac arrhythmias: Secondary | ICD-10-CM | POA: Diagnosis not present

## 2021-01-24 DIAGNOSIS — Z8249 Family history of ischemic heart disease and other diseases of the circulatory system: Secondary | ICD-10-CM | POA: Diagnosis not present

## 2021-01-24 DIAGNOSIS — I10 Essential (primary) hypertension: Secondary | ICD-10-CM | POA: Diagnosis not present

## 2021-01-24 DIAGNOSIS — E785 Hyperlipidemia, unspecified: Secondary | ICD-10-CM | POA: Diagnosis not present

## 2021-01-24 DIAGNOSIS — Y929 Unspecified place or not applicable: Secondary | ICD-10-CM | POA: Diagnosis not present

## 2021-01-24 DIAGNOSIS — G629 Polyneuropathy, unspecified: Secondary | ICD-10-CM | POA: Diagnosis not present

## 2021-01-24 DIAGNOSIS — S14159A Other incomplete lesion at unspecified level of cervical spinal cord, initial encounter: Secondary | ICD-10-CM | POA: Diagnosis not present

## 2021-01-24 DIAGNOSIS — M5003 Cervical disc disorder with myelopathy, cervicothoracic region: Secondary | ICD-10-CM | POA: Diagnosis not present

## 2021-01-24 DIAGNOSIS — M47812 Spondylosis without myelopathy or radiculopathy, cervical region: Secondary | ICD-10-CM | POA: Diagnosis not present

## 2021-01-24 DIAGNOSIS — Z79899 Other long term (current) drug therapy: Secondary | ICD-10-CM | POA: Diagnosis not present

## 2021-01-24 DIAGNOSIS — M4803 Spinal stenosis, cervicothoracic region: Secondary | ICD-10-CM | POA: Diagnosis not present

## 2021-01-24 DIAGNOSIS — M5023 Other cervical disc displacement, cervicothoracic region: Secondary | ICD-10-CM | POA: Diagnosis not present

## 2021-01-24 DIAGNOSIS — Z981 Arthrodesis status: Secondary | ICD-10-CM | POA: Diagnosis not present

## 2021-01-24 DIAGNOSIS — Z853 Personal history of malignant neoplasm of breast: Secondary | ICD-10-CM | POA: Diagnosis not present

## 2021-01-24 DIAGNOSIS — M4802 Spinal stenosis, cervical region: Secondary | ICD-10-CM | POA: Diagnosis not present

## 2021-01-24 DIAGNOSIS — Z9011 Acquired absence of right breast and nipple: Secondary | ICD-10-CM | POA: Diagnosis not present

## 2021-02-05 ENCOUNTER — Ambulatory Visit: Payer: BC Managed Care – PPO | Admitting: Neurology

## 2021-02-14 DIAGNOSIS — R29898 Other symptoms and signs involving the musculoskeletal system: Secondary | ICD-10-CM | POA: Diagnosis not present

## 2021-02-14 DIAGNOSIS — Z7409 Other reduced mobility: Secondary | ICD-10-CM | POA: Diagnosis not present

## 2021-02-14 DIAGNOSIS — Z789 Other specified health status: Secondary | ICD-10-CM | POA: Diagnosis not present

## 2021-02-14 DIAGNOSIS — M4802 Spinal stenosis, cervical region: Secondary | ICD-10-CM | POA: Diagnosis not present

## 2021-02-14 DIAGNOSIS — M5382 Other specified dorsopathies, cervical region: Secondary | ICD-10-CM | POA: Diagnosis not present

## 2021-02-25 ENCOUNTER — Encounter: Payer: Self-pay | Admitting: Cardiology

## 2021-02-25 ENCOUNTER — Ambulatory Visit: Payer: BC Managed Care – PPO | Admitting: Cardiology

## 2021-02-25 ENCOUNTER — Other Ambulatory Visit: Payer: Self-pay

## 2021-02-25 ENCOUNTER — Ambulatory Visit (INDEPENDENT_AMBULATORY_CARE_PROVIDER_SITE_OTHER): Payer: BC Managed Care – PPO | Admitting: Cardiology

## 2021-02-25 VITALS — BP 150/82 | HR 81 | Ht 64.0 in | Wt 168.0 lb

## 2021-02-25 DIAGNOSIS — I10 Essential (primary) hypertension: Secondary | ICD-10-CM

## 2021-02-25 MED ORDER — VALSARTAN 320 MG PO TABS: 320.0000 mg | ORAL_TABLET | Freq: Every day | ORAL | 5 refills | Status: DC

## 2021-02-25 NOTE — Progress Notes (Signed)
Cardiology Office Note:    Date:  02/25/2021   ID:  Sara Lara, Sara Lara November 01, 1955, MRN [id number]  PCP:  Lois Huxley, PA   [facility] Providers Cardiologist:  None     Referring MD: Lois Huxley, PA   Chief Complaint  Patient presents with   Other    3 month follow up -- Meds reviewed verbally with patient.     History of Present Illness:    Sara Lara is a 66 y.o. female with a hx of hypertension, chronic neck pain who presents for follow-up.  Previously seen due to hypertension.    Valsartan was increased to 160 mg twice daily.  She states blood pressures are improved.  She underwent surgery to her neck last month, due to cervical spinal stenosis and degenerative disease.  States leg weakness is improving.  Has not checked her blood pressure of late.  Walks with a walker to help with ambulation.  Still has peripheral neuropathy/pain in her legs.  Prior notes Echocardiogram 11/2020 EF 60 to 60%, impaired mild MR   Past Medical History:  Diagnosis Date   Hypertension     Past Surgical History:  Procedure Laterality Date   ANTERIOR CERVICAL DISCECTOMY & FUSION SINGLE LEVEL C7-T1      Current Medications: Current Meds  Medication Sig   Calcium Carbonate (CALCIUM 600 PO) Take 600 mg by mouth 2 (two) times daily.   fluticasone (FLONASE) 50 MCG/ACT nasal spray Place into both nostrils daily.   gabapentin (NEURONTIN) 300 MG capsule Take 2 capsules (600 mg total) by mouth 3 (three) times daily.   indapamide (LOZOL) 2.5 MG tablet Take 2 tablets (5 mg total) by mouth daily.   MAGNESIUM PO Take by mouth daily.   Melatonin 10 MG TABS Take 1 tablet by mouth at bedtime as needed.   Multiple Vitamin (MULTIVITAMIN ADULT PO) Take 1 tablet by mouth daily.   POTASSIUM CHLORIDE PO Take 20 mEq by mouth daily.   [DISCONTINUED] valsartan (DIOVAN) 160 MG tablet Take 1 tablet (160 mg total) by mouth 2 (two) times daily.     Allergies:   Darvon [propoxyphene], Percocet  [oxycodone-acetaminophen], and Tramadol   Social History   Socioeconomic History   Marital status: Divorced    Spouse name: Not on file   Number of children: Not on file   Years of education: Not on file   Highest education level: Not on file  Occupational History   Not on file  Tobacco Use   Smoking status: Never   Smokeless tobacco: Never  Substance and Sexual Activity   Alcohol use: Not on file   Drug use: Not on file   Sexual activity: Not on file  Other Topics Concern   Not on file  Social History Narrative   Not on file   Social Determinants of Health   Financial Resource Strain: Not on file  Food Insecurity: Not on file  Transportation Needs: Not on file  Physical Activity: Not on file  Stress: Not on file  Social Connections: Not on file     Family History: The patient's family history is not on file.  ROS:   Please see the history of present illness.     All other systems reviewed and are negative.  EKGs/Labs/Other Studies Reviewed:    The following studies were reviewed today:  EKG:  EKG is ordered today.  EKG shows sinus rhythm with PACs.  Recent Labs: 08/28/2020: ALT 20; BUN 24; Creatinine, Ser  0.93; Hemoglobin 12.8; Platelets 303; Potassium 3.4; Sodium 141 12/06/2020: TSH 3.100  Recent Lipid Panel No results found for: CHOL, TRIG, HDL, CHOLHDL, VLDL, LDLCALC, LDLDIRECT   Risk Assessment/Calculations:          Physical Exam:    VS:  BP (!) 150/82 (BP Location: Left Arm, Patient Position: Sitting, Cuff Size: Large)    Pulse 81    Ht 5\' 4"  (1.626 m)    Wt 168 lb (76.2 kg)    SpO2 97%    BMI 28.84 kg/m     Wt Readings from Last 3 Encounters:  02/25/21 168 lb (76.2 kg)  12/06/20 163 lb (73.9 kg)  11/23/20 163 lb (73.9 kg)     GEN:  Well nourished, well developed in no acute distress HEENT: Normal NECK: No JVD; No carotid bruits LYMPHATICS: No lymphadenopathy CARDIAC: RRR, 2/6 systolic murmur RESPIRATORY:  Clear to auscultation without  rales, wheezing or rhonchi  ABDOMEN: Soft, non-tender, non-distended MUSCULOSKELETAL:  No edema; No deformity  SKIN: Warm and dry NEUROLOGIC:  Alert and oriented x 3 PSYCHIATRIC:  Normal affect   ASSESSMENT:    1. Primary hypertension      PLAN:    In order of problems listed above:  Hypertension, BP still elevated.  Continue indapamide to 5 mg 1 tab daily. valsartan 320 mg twice daily.  Chronic musculoskeletal pain possibly contributing to elevated BP.  Continue to monitor log.  If BP elevated at follow-up visit after adequate pain control, physical therapy, pressure, consider adding Norvasc.  Follow-up in 3 months.     Medication Adjustments/Labs and Tests Ordered: Current medicines are reviewed at length with the patient today.  Concerns regarding medicines are outlined above.  Orders Placed This Encounter  Procedures   EKG 12-Lead     Meds ordered this encounter  Medications   valsartan (DIOVAN) 320 MG tablet    Sig: Take 1 tablet (320 mg total) by mouth daily.    Dispense:  30 tablet    Refill:  5      Patient Instructions  Medication Instructions:    Change the way you take Valsartan:  Take Valsartan 320 MG once a day. We have sent 320 MG tablets to your pharmacy.  *If you need a refill on your cardiac medications before your next appointment, please call your pharmacy*   Lab Work: None ordered If you have labs (blood work) drawn today and your tests are completely normal, you will receive your results only by: [place] (if you have MyChart) OR A paper copy in the mail If you have any lab test that is abnormal or we need to change your treatment, we will call you to review the results.   Testing/Procedures: None ordered   Follow-Up: At [facility], you and your health needs are our priority.  As part of our continuing mission to provide you with exceptional heart care, we have created designated Provider Care Teams.  These Care Teams  include your primary Cardiologist (physician) and Advanced Practice Providers (APPs -  Physician Assistants and Nurse Practitioners) who all work together to provide you with the care you need, when you need it.  We recommend signing up for the patient portal called "MyChart".  Sign up information is provided on this After Visit Summary.  MyChart is used to connect with patients for Virtual Visits (Telemedicine).  Patients are able to view lab/test results, encounter notes, upcoming appointments, etc.  Non-urgent messages can be sent to your provider as  well.   To learn more about what you can do with MyChart, go to [email or web address].    Your next appointment:   3 month(s)  The format for your next appointment:   In Person  Provider:   You may see Kate Sable, MD or one of the following Advanced Practice Providers on your designated Care Team:   Murray Hodgkins, NP Christell Faith, PA-C Cadence Kathlen Mody, [place]    Other Instructions     Signed, Kate Sable, MD  02/25/2021 4:37 PM    [place]

## 2021-02-25 NOTE — Patient Instructions (Signed)
Medication Instructions:    Change the way you take Valsartan:  Take Valsartan 320 MG once a day. We have sent 320 MG tablets to your pharmacy.  *If you need a refill on your cardiac medications before your next appointment, please call your pharmacy*   Lab Work: None ordered If you have labs (blood work) drawn today and your tests are completely normal, you will receive your results only by: [place] (if you have MyChart) OR A paper copy in the mail If you have any lab test that is abnormal or we need to change your treatment, we will call you to review the results.   Testing/Procedures: None ordered   Follow-Up: At [facility], you and your health needs are our priority.  As part of our continuing mission to provide you with exceptional heart care, we have created designated Provider Care Teams.  These Care Teams include your primary Cardiologist (physician) and Advanced Practice Providers (APPs -  Physician Assistants and Nurse Practitioners) who all work together to provide you with the care you need, when you need it.  We recommend signing up for the patient portal called "MyChart".  Sign up information is provided on this After Visit Summary.  MyChart is used to connect with patients for Virtual Visits (Telemedicine).  Patients are able to view lab/test results, encounter notes, upcoming appointments, etc.  Non-urgent messages can be sent to your provider as well.   To learn more about what you can do with MyChart, go to [email or web address].    Your next appointment:   3 month(s)  The format for your next appointment:   In Person  Provider:   You may see Kate Sable, MD or one of the following Advanced Practice Providers on your designated Care Team:   Murray Hodgkins, NP Christell Faith, PA-C Cadence Kathlen Mody, [place]    Other Instructions

## 2021-02-26 DIAGNOSIS — R29898 Other symptoms and signs involving the musculoskeletal system: Secondary | ICD-10-CM | POA: Diagnosis not present

## 2021-02-26 DIAGNOSIS — M545 Low back pain, unspecified: Secondary | ICD-10-CM | POA: Diagnosis not present

## 2021-02-26 DIAGNOSIS — Z7409 Other reduced mobility: Secondary | ICD-10-CM | POA: Diagnosis not present

## 2021-02-26 DIAGNOSIS — Z789 Other specified health status: Secondary | ICD-10-CM | POA: Diagnosis not present

## 2021-02-26 DIAGNOSIS — M5382 Other specified dorsopathies, cervical region: Secondary | ICD-10-CM | POA: Diagnosis not present

## 2021-02-26 DIAGNOSIS — M4802 Spinal stenosis, cervical region: Secondary | ICD-10-CM | POA: Diagnosis not present

## 2021-02-28 DIAGNOSIS — R29898 Other symptoms and signs involving the musculoskeletal system: Secondary | ICD-10-CM | POA: Diagnosis not present

## 2021-02-28 DIAGNOSIS — M5382 Other specified dorsopathies, cervical region: Secondary | ICD-10-CM | POA: Diagnosis not present

## 2021-02-28 DIAGNOSIS — Z789 Other specified health status: Secondary | ICD-10-CM | POA: Diagnosis not present

## 2021-02-28 DIAGNOSIS — Z7409 Other reduced mobility: Secondary | ICD-10-CM | POA: Diagnosis not present

## 2021-02-28 DIAGNOSIS — M4802 Spinal stenosis, cervical region: Secondary | ICD-10-CM | POA: Diagnosis not present

## 2021-02-28 DIAGNOSIS — M545 Low back pain, unspecified: Secondary | ICD-10-CM | POA: Diagnosis not present

## 2021-03-05 DIAGNOSIS — M4802 Spinal stenosis, cervical region: Secondary | ICD-10-CM | POA: Diagnosis not present

## 2021-03-05 DIAGNOSIS — M545 Low back pain, unspecified: Secondary | ICD-10-CM | POA: Diagnosis not present

## 2021-03-05 DIAGNOSIS — M5382 Other specified dorsopathies, cervical region: Secondary | ICD-10-CM | POA: Diagnosis not present

## 2021-03-05 DIAGNOSIS — Z7409 Other reduced mobility: Secondary | ICD-10-CM | POA: Diagnosis not present

## 2021-03-05 DIAGNOSIS — R29898 Other symptoms and signs involving the musculoskeletal system: Secondary | ICD-10-CM | POA: Diagnosis not present

## 2021-03-05 DIAGNOSIS — Z789 Other specified health status: Secondary | ICD-10-CM | POA: Diagnosis not present

## 2021-03-07 DIAGNOSIS — R29898 Other symptoms and signs involving the musculoskeletal system: Secondary | ICD-10-CM | POA: Diagnosis not present

## 2021-03-07 DIAGNOSIS — M4802 Spinal stenosis, cervical region: Secondary | ICD-10-CM | POA: Diagnosis not present

## 2021-03-07 DIAGNOSIS — M5382 Other specified dorsopathies, cervical region: Secondary | ICD-10-CM | POA: Diagnosis not present

## 2021-03-07 DIAGNOSIS — Z789 Other specified health status: Secondary | ICD-10-CM | POA: Diagnosis not present

## 2021-03-07 DIAGNOSIS — M545 Low back pain, unspecified: Secondary | ICD-10-CM | POA: Diagnosis not present

## 2021-03-07 DIAGNOSIS — Z7409 Other reduced mobility: Secondary | ICD-10-CM | POA: Diagnosis not present

## 2021-03-11 DIAGNOSIS — M4802 Spinal stenosis, cervical region: Secondary | ICD-10-CM | POA: Diagnosis not present

## 2021-03-11 DIAGNOSIS — R29898 Other symptoms and signs involving the musculoskeletal system: Secondary | ICD-10-CM | POA: Diagnosis not present

## 2021-03-11 DIAGNOSIS — Z7409 Other reduced mobility: Secondary | ICD-10-CM | POA: Diagnosis not present

## 2021-03-11 DIAGNOSIS — M5382 Other specified dorsopathies, cervical region: Secondary | ICD-10-CM | POA: Diagnosis not present

## 2021-03-11 DIAGNOSIS — Z789 Other specified health status: Secondary | ICD-10-CM | POA: Diagnosis not present

## 2021-03-11 DIAGNOSIS — M545 Low back pain, unspecified: Secondary | ICD-10-CM | POA: Diagnosis not present

## 2021-03-12 DIAGNOSIS — Z1231 Encounter for screening mammogram for malignant neoplasm of breast: Secondary | ICD-10-CM | POA: Diagnosis not present

## 2021-03-22 DIAGNOSIS — Z124 Encounter for screening for malignant neoplasm of cervix: Secondary | ICD-10-CM | POA: Diagnosis not present

## 2021-03-22 DIAGNOSIS — Z01419 Encounter for gynecological examination (general) (routine) without abnormal findings: Secondary | ICD-10-CM | POA: Diagnosis not present

## 2021-03-22 DIAGNOSIS — Z01411 Encounter for gynecological examination (general) (routine) with abnormal findings: Secondary | ICD-10-CM | POA: Diagnosis not present

## 2021-03-22 DIAGNOSIS — Z6829 Body mass index (BMI) 29.0-29.9, adult: Secondary | ICD-10-CM | POA: Diagnosis not present

## 2021-03-25 DIAGNOSIS — Z9889 Other specified postprocedural states: Secondary | ICD-10-CM | POA: Diagnosis not present

## 2021-03-25 DIAGNOSIS — Z7409 Other reduced mobility: Secondary | ICD-10-CM | POA: Diagnosis not present

## 2021-03-25 DIAGNOSIS — M542 Cervicalgia: Secondary | ICD-10-CM | POA: Diagnosis not present

## 2021-03-25 DIAGNOSIS — R29898 Other symptoms and signs involving the musculoskeletal system: Secondary | ICD-10-CM | POA: Diagnosis not present

## 2021-03-28 DIAGNOSIS — Z981 Arthrodesis status: Secondary | ICD-10-CM | POA: Diagnosis not present

## 2021-03-28 DIAGNOSIS — M5011 Cervical disc disorder with radiculopathy,  high cervical region: Secondary | ICD-10-CM | POA: Diagnosis not present

## 2021-03-28 DIAGNOSIS — M47816 Spondylosis without myelopathy or radiculopathy, lumbar region: Secondary | ICD-10-CM | POA: Diagnosis not present

## 2021-03-28 DIAGNOSIS — M4712 Other spondylosis with myelopathy, cervical region: Secondary | ICD-10-CM | POA: Diagnosis not present

## 2021-03-28 DIAGNOSIS — M5031 Other cervical disc degeneration,  high cervical region: Secondary | ICD-10-CM | POA: Diagnosis not present

## 2021-03-28 DIAGNOSIS — M48061 Spinal stenosis, lumbar region without neurogenic claudication: Secondary | ICD-10-CM | POA: Diagnosis not present

## 2021-03-28 DIAGNOSIS — M5002 Cervical disc disorder with myelopathy, mid-cervical region, unspecified level: Secondary | ICD-10-CM | POA: Diagnosis not present

## 2021-03-28 DIAGNOSIS — Z79899 Other long term (current) drug therapy: Secondary | ICD-10-CM | POA: Diagnosis not present

## 2021-03-28 DIAGNOSIS — M5001 Cervical disc disorder with myelopathy,  high cervical region: Secondary | ICD-10-CM | POA: Diagnosis not present

## 2021-03-28 DIAGNOSIS — M79605 Pain in left leg: Secondary | ICD-10-CM | POA: Diagnosis not present

## 2021-03-28 DIAGNOSIS — M5012 Mid-cervical disc disorder, unspecified level: Secondary | ICD-10-CM | POA: Diagnosis not present

## 2021-03-28 DIAGNOSIS — Z9181 History of falling: Secondary | ICD-10-CM | POA: Diagnosis not present

## 2021-04-10 DIAGNOSIS — R29898 Other symptoms and signs involving the musculoskeletal system: Secondary | ICD-10-CM | POA: Diagnosis not present

## 2021-04-10 DIAGNOSIS — Z789 Other specified health status: Secondary | ICD-10-CM | POA: Diagnosis not present

## 2021-04-10 DIAGNOSIS — M545 Low back pain, unspecified: Secondary | ICD-10-CM | POA: Diagnosis not present

## 2021-04-10 DIAGNOSIS — Z7409 Other reduced mobility: Secondary | ICD-10-CM | POA: Diagnosis not present

## 2021-04-10 DIAGNOSIS — M5382 Other specified dorsopathies, cervical region: Secondary | ICD-10-CM | POA: Diagnosis not present

## 2021-04-10 DIAGNOSIS — M4802 Spinal stenosis, cervical region: Secondary | ICD-10-CM | POA: Diagnosis not present

## 2021-04-10 DIAGNOSIS — M4712 Other spondylosis with myelopathy, cervical region: Secondary | ICD-10-CM | POA: Diagnosis not present

## 2021-04-10 DIAGNOSIS — Z9889 Other specified postprocedural states: Secondary | ICD-10-CM | POA: Diagnosis not present

## 2021-04-10 DIAGNOSIS — M542 Cervicalgia: Secondary | ICD-10-CM | POA: Diagnosis not present

## 2021-04-12 DIAGNOSIS — Z79899 Other long term (current) drug therapy: Secondary | ICD-10-CM | POA: Diagnosis not present

## 2021-04-12 DIAGNOSIS — Z885 Allergy status to narcotic agent status: Secondary | ICD-10-CM | POA: Diagnosis not present

## 2021-04-12 DIAGNOSIS — M4712 Other spondylosis with myelopathy, cervical region: Secondary | ICD-10-CM | POA: Diagnosis not present

## 2021-04-12 DIAGNOSIS — Z981 Arthrodesis status: Secondary | ICD-10-CM | POA: Diagnosis not present

## 2021-04-12 DIAGNOSIS — R202 Paresthesia of skin: Secondary | ICD-10-CM | POA: Diagnosis not present

## 2021-04-12 DIAGNOSIS — Z888 Allergy status to other drugs, medicaments and biological substances status: Secondary | ICD-10-CM | POA: Diagnosis not present

## 2021-04-12 DIAGNOSIS — Z886 Allergy status to analgesic agent status: Secondary | ICD-10-CM | POA: Diagnosis not present

## 2021-04-12 DIAGNOSIS — M47812 Spondylosis without myelopathy or radiculopathy, cervical region: Secondary | ICD-10-CM | POA: Diagnosis not present

## 2021-04-18 DIAGNOSIS — M4802 Spinal stenosis, cervical region: Secondary | ICD-10-CM | POA: Diagnosis not present

## 2021-04-18 DIAGNOSIS — R29898 Other symptoms and signs involving the musculoskeletal system: Secondary | ICD-10-CM | POA: Diagnosis not present

## 2021-04-18 DIAGNOSIS — M542 Cervicalgia: Secondary | ICD-10-CM | POA: Diagnosis not present

## 2021-04-18 DIAGNOSIS — M5382 Other specified dorsopathies, cervical region: Secondary | ICD-10-CM | POA: Diagnosis not present

## 2021-04-18 DIAGNOSIS — M4712 Other spondylosis with myelopathy, cervical region: Secondary | ICD-10-CM | POA: Diagnosis not present

## 2021-04-18 DIAGNOSIS — Z9889 Other specified postprocedural states: Secondary | ICD-10-CM | POA: Diagnosis not present

## 2021-04-18 DIAGNOSIS — Z789 Other specified health status: Secondary | ICD-10-CM | POA: Diagnosis not present

## 2021-04-18 DIAGNOSIS — M545 Low back pain, unspecified: Secondary | ICD-10-CM | POA: Diagnosis not present

## 2021-04-18 DIAGNOSIS — Z7409 Other reduced mobility: Secondary | ICD-10-CM | POA: Diagnosis not present

## 2021-04-19 DIAGNOSIS — M4726 Other spondylosis with radiculopathy, lumbar region: Secondary | ICD-10-CM | POA: Diagnosis not present

## 2021-04-19 DIAGNOSIS — M48061 Spinal stenosis, lumbar region without neurogenic claudication: Secondary | ICD-10-CM | POA: Diagnosis not present

## 2021-04-19 DIAGNOSIS — M5116 Intervertebral disc disorders with radiculopathy, lumbar region: Secondary | ICD-10-CM | POA: Diagnosis not present

## 2021-04-19 DIAGNOSIS — M545 Low back pain, unspecified: Secondary | ICD-10-CM | POA: Diagnosis not present

## 2021-04-22 DIAGNOSIS — M4712 Other spondylosis with myelopathy, cervical region: Secondary | ICD-10-CM | POA: Diagnosis not present

## 2021-04-22 DIAGNOSIS — M4312 Spondylolisthesis, cervical region: Secondary | ICD-10-CM | POA: Diagnosis not present

## 2021-04-24 DIAGNOSIS — B3731 Acute candidiasis of vulva and vagina: Secondary | ICD-10-CM | POA: Diagnosis not present

## 2021-04-24 DIAGNOSIS — R350 Frequency of micturition: Secondary | ICD-10-CM | POA: Diagnosis not present

## 2021-04-24 DIAGNOSIS — N390 Urinary tract infection, site not specified: Secondary | ICD-10-CM | POA: Diagnosis not present

## 2021-04-24 DIAGNOSIS — N898 Other specified noninflammatory disorders of vagina: Secondary | ICD-10-CM | POA: Diagnosis not present

## 2021-04-24 DIAGNOSIS — N760 Acute vaginitis: Secondary | ICD-10-CM | POA: Diagnosis not present

## 2021-04-25 DIAGNOSIS — R29898 Other symptoms and signs involving the musculoskeletal system: Secondary | ICD-10-CM | POA: Diagnosis not present

## 2021-04-25 DIAGNOSIS — Z7409 Other reduced mobility: Secondary | ICD-10-CM | POA: Diagnosis not present

## 2021-04-25 DIAGNOSIS — Z9889 Other specified postprocedural states: Secondary | ICD-10-CM | POA: Diagnosis not present

## 2021-04-25 DIAGNOSIS — M545 Low back pain, unspecified: Secondary | ICD-10-CM | POA: Diagnosis not present

## 2021-04-25 DIAGNOSIS — M4712 Other spondylosis with myelopathy, cervical region: Secondary | ICD-10-CM | POA: Diagnosis not present

## 2021-04-25 DIAGNOSIS — M4802 Spinal stenosis, cervical region: Secondary | ICD-10-CM | POA: Diagnosis not present

## 2021-04-25 DIAGNOSIS — Z789 Other specified health status: Secondary | ICD-10-CM | POA: Diagnosis not present

## 2021-04-25 DIAGNOSIS — M5382 Other specified dorsopathies, cervical region: Secondary | ICD-10-CM | POA: Diagnosis not present

## 2021-04-25 DIAGNOSIS — M542 Cervicalgia: Secondary | ICD-10-CM | POA: Diagnosis not present

## 2021-05-03 DIAGNOSIS — M4802 Spinal stenosis, cervical region: Secondary | ICD-10-CM | POA: Diagnosis not present

## 2021-05-03 DIAGNOSIS — Z9889 Other specified postprocedural states: Secondary | ICD-10-CM | POA: Diagnosis not present

## 2021-05-03 DIAGNOSIS — M545 Low back pain, unspecified: Secondary | ICD-10-CM | POA: Diagnosis not present

## 2021-05-03 DIAGNOSIS — M4712 Other spondylosis with myelopathy, cervical region: Secondary | ICD-10-CM | POA: Diagnosis not present

## 2021-05-03 DIAGNOSIS — M542 Cervicalgia: Secondary | ICD-10-CM | POA: Diagnosis not present

## 2021-05-03 DIAGNOSIS — M5382 Other specified dorsopathies, cervical region: Secondary | ICD-10-CM | POA: Diagnosis not present

## 2021-05-03 DIAGNOSIS — Z7409 Other reduced mobility: Secondary | ICD-10-CM | POA: Diagnosis not present

## 2021-05-03 DIAGNOSIS — R29898 Other symptoms and signs involving the musculoskeletal system: Secondary | ICD-10-CM | POA: Diagnosis not present

## 2021-05-03 DIAGNOSIS — Z789 Other specified health status: Secondary | ICD-10-CM | POA: Diagnosis not present

## 2021-05-07 DIAGNOSIS — R29898 Other symptoms and signs involving the musculoskeletal system: Secondary | ICD-10-CM | POA: Diagnosis not present

## 2021-05-07 DIAGNOSIS — M4802 Spinal stenosis, cervical region: Secondary | ICD-10-CM | POA: Diagnosis not present

## 2021-05-07 DIAGNOSIS — Z9889 Other specified postprocedural states: Secondary | ICD-10-CM | POA: Diagnosis not present

## 2021-05-07 DIAGNOSIS — M5382 Other specified dorsopathies, cervical region: Secondary | ICD-10-CM | POA: Diagnosis not present

## 2021-05-07 DIAGNOSIS — Z789 Other specified health status: Secondary | ICD-10-CM | POA: Diagnosis not present

## 2021-05-07 DIAGNOSIS — M4712 Other spondylosis with myelopathy, cervical region: Secondary | ICD-10-CM | POA: Diagnosis not present

## 2021-05-07 DIAGNOSIS — M542 Cervicalgia: Secondary | ICD-10-CM | POA: Diagnosis not present

## 2021-05-07 DIAGNOSIS — M545 Low back pain, unspecified: Secondary | ICD-10-CM | POA: Diagnosis not present

## 2021-05-07 DIAGNOSIS — Z7409 Other reduced mobility: Secondary | ICD-10-CM | POA: Diagnosis not present

## 2021-05-15 ENCOUNTER — Encounter: Payer: Self-pay | Admitting: Internal Medicine

## 2021-05-16 DIAGNOSIS — Z789 Other specified health status: Secondary | ICD-10-CM | POA: Diagnosis not present

## 2021-05-16 DIAGNOSIS — R29898 Other symptoms and signs involving the musculoskeletal system: Secondary | ICD-10-CM | POA: Diagnosis not present

## 2021-05-16 DIAGNOSIS — Z9889 Other specified postprocedural states: Secondary | ICD-10-CM | POA: Diagnosis not present

## 2021-05-16 DIAGNOSIS — M542 Cervicalgia: Secondary | ICD-10-CM | POA: Diagnosis not present

## 2021-05-16 DIAGNOSIS — Z7409 Other reduced mobility: Secondary | ICD-10-CM | POA: Diagnosis not present

## 2021-05-16 DIAGNOSIS — M4802 Spinal stenosis, cervical region: Secondary | ICD-10-CM | POA: Diagnosis not present

## 2021-05-16 DIAGNOSIS — M5382 Other specified dorsopathies, cervical region: Secondary | ICD-10-CM | POA: Diagnosis not present

## 2021-05-16 DIAGNOSIS — M4712 Other spondylosis with myelopathy, cervical region: Secondary | ICD-10-CM | POA: Diagnosis not present

## 2021-05-16 DIAGNOSIS — M545 Low back pain, unspecified: Secondary | ICD-10-CM | POA: Diagnosis not present

## 2021-05-21 DIAGNOSIS — Z7409 Other reduced mobility: Secondary | ICD-10-CM | POA: Diagnosis not present

## 2021-05-21 DIAGNOSIS — R29898 Other symptoms and signs involving the musculoskeletal system: Secondary | ICD-10-CM | POA: Diagnosis not present

## 2021-05-21 DIAGNOSIS — M5382 Other specified dorsopathies, cervical region: Secondary | ICD-10-CM | POA: Diagnosis not present

## 2021-05-21 DIAGNOSIS — M4712 Other spondylosis with myelopathy, cervical region: Secondary | ICD-10-CM | POA: Diagnosis not present

## 2021-05-21 DIAGNOSIS — M4802 Spinal stenosis, cervical region: Secondary | ICD-10-CM | POA: Diagnosis not present

## 2021-05-21 DIAGNOSIS — M542 Cervicalgia: Secondary | ICD-10-CM | POA: Diagnosis not present

## 2021-05-21 DIAGNOSIS — Z789 Other specified health status: Secondary | ICD-10-CM | POA: Diagnosis not present

## 2021-05-23 DIAGNOSIS — M542 Cervicalgia: Secondary | ICD-10-CM | POA: Diagnosis not present

## 2021-05-23 DIAGNOSIS — M4802 Spinal stenosis, cervical region: Secondary | ICD-10-CM | POA: Diagnosis not present

## 2021-05-23 DIAGNOSIS — M5382 Other specified dorsopathies, cervical region: Secondary | ICD-10-CM | POA: Diagnosis not present

## 2021-05-23 DIAGNOSIS — M4712 Other spondylosis with myelopathy, cervical region: Secondary | ICD-10-CM | POA: Diagnosis not present

## 2021-05-23 DIAGNOSIS — Z7409 Other reduced mobility: Secondary | ICD-10-CM | POA: Diagnosis not present

## 2021-05-23 DIAGNOSIS — R29898 Other symptoms and signs involving the musculoskeletal system: Secondary | ICD-10-CM | POA: Diagnosis not present

## 2021-05-23 DIAGNOSIS — Z789 Other specified health status: Secondary | ICD-10-CM | POA: Diagnosis not present

## 2021-05-28 DIAGNOSIS — M5382 Other specified dorsopathies, cervical region: Secondary | ICD-10-CM | POA: Diagnosis not present

## 2021-05-28 DIAGNOSIS — Z7409 Other reduced mobility: Secondary | ICD-10-CM | POA: Diagnosis not present

## 2021-05-28 DIAGNOSIS — R29898 Other symptoms and signs involving the musculoskeletal system: Secondary | ICD-10-CM | POA: Diagnosis not present

## 2021-05-28 DIAGNOSIS — M4802 Spinal stenosis, cervical region: Secondary | ICD-10-CM | POA: Diagnosis not present

## 2021-05-28 DIAGNOSIS — M542 Cervicalgia: Secondary | ICD-10-CM | POA: Diagnosis not present

## 2021-05-28 DIAGNOSIS — Z789 Other specified health status: Secondary | ICD-10-CM | POA: Diagnosis not present

## 2021-05-28 DIAGNOSIS — M4712 Other spondylosis with myelopathy, cervical region: Secondary | ICD-10-CM | POA: Diagnosis not present

## 2021-05-31 ENCOUNTER — Ambulatory Visit: Payer: BC Managed Care – PPO | Admitting: Cardiology

## 2021-06-10 DIAGNOSIS — M2550 Pain in unspecified joint: Secondary | ICD-10-CM | POA: Diagnosis not present

## 2021-06-10 DIAGNOSIS — M792 Neuralgia and neuritis, unspecified: Secondary | ICD-10-CM | POA: Diagnosis not present

## 2021-06-10 DIAGNOSIS — M47812 Spondylosis without myelopathy or radiculopathy, cervical region: Secondary | ICD-10-CM | POA: Diagnosis not present

## 2021-06-10 DIAGNOSIS — R269 Unspecified abnormalities of gait and mobility: Secondary | ICD-10-CM | POA: Diagnosis not present

## 2021-06-10 DIAGNOSIS — R748 Abnormal levels of other serum enzymes: Secondary | ICD-10-CM | POA: Diagnosis not present

## 2021-06-11 ENCOUNTER — Ambulatory Visit (AMBULATORY_SURGERY_CENTER): Payer: BC Managed Care – PPO | Admitting: *Deleted

## 2021-06-11 VITALS — Ht 64.0 in | Wt 165.0 lb

## 2021-06-11 DIAGNOSIS — Z1211 Encounter for screening for malignant neoplasm of colon: Secondary | ICD-10-CM

## 2021-06-11 MED ORDER — NA SULFATE-K SULFATE-MG SULF 17.5-3.13-1.6 GM/177ML PO SOLN: 1.0000 | Freq: Once | ORAL | 0 refills | Status: AC

## 2021-06-11 NOTE — Progress Notes (Signed)

## 2021-06-21 ENCOUNTER — Ambulatory Visit (INDEPENDENT_AMBULATORY_CARE_PROVIDER_SITE_OTHER): Payer: BC Managed Care – PPO | Admitting: Cardiology

## 2021-06-21 ENCOUNTER — Encounter: Payer: Self-pay | Admitting: Cardiology

## 2021-06-21 VITALS — BP 144/88 | HR 84 | Ht 64.0 in | Wt 173.0 lb

## 2021-06-21 DIAGNOSIS — I10 Essential (primary) hypertension: Secondary | ICD-10-CM

## 2021-06-21 MED ORDER — AMLODIPINE BESYLATE 5 MG PO TABS: 5.0000 mg | ORAL_TABLET | Freq: Every day | ORAL | 3 refills | Status: DC

## 2021-06-21 NOTE — Progress Notes (Signed)
Cardiology Office Note:    Date:  06/21/2021   ID:  Sara, Lara 1955/05/04, MRN [id number]  PCP:  Sara Huxley, PA   [facility] Providers Cardiologist:  None     Referring MD: Sara Huxley, PA   No chief complaint on file.   History of Present Illness:    Sara Lara is a 66 y.o. female with a hx of hypertension, chronic neck pain who presents for follow-up.    Being seen for hypertension and medication management.  Valsartan increased to 320 mg daily, also takes indapamide 5 mg daily.  Blood pressures have improved but still elevated, averaging 716 systolic at home.  She describes chronic pain in her neck and back likely contributing to elevated blood pressures.  Prior notes Echocardiogram 11/2020 EF 60 to 60%, impaired mild MR   Past Medical History:  Diagnosis Date   Allergy    seasonal   Anemia    "when had breast cancer,1991   Arthritis    back,toe,ankles   Cancer ([place])    right breast   Cataract    beginning stage   Heart murmur    Hypertension    Neuromuscular disorder (HCC)    PAC (premature atrial contraction)    Sciatic nerve disease     Past Surgical History:  Procedure Laterality Date   ANTERIOR CERVICAL DISCECTOMY & FUSION SINGLE LEVEL C7-T1     january 5 th 2023   BREAST LUMPECTOMY WITH AXILLARY LYMPH NODE DISSECTION Right    BREAST RECONSTRUCTION Right    bresat surgery Left    reduction    Current Medications: Current Meds  Medication Sig   amLODipine (NORVASC) 5 MG tablet Take 1 tablet (5 mg total) by mouth daily.   Calcium Carbonate (CALCIUM 600 PO) Take 600 mg by mouth 2 (two) times daily.   cyanocobalamin 1000 MCG tablet Take by mouth.   fluticasone (FLONASE) 50 MCG/ACT nasal spray Place into both nostrils as needed.   gabapentin (NEURONTIN) 300 MG capsule Take 2 capsules (600 mg total) by mouth 3 (three) times daily.   indapamide (LOZOL) 2.5 MG tablet Take 2 tablets (5 mg total) by mouth daily.   MAGNESIUM PO Take by  mouth daily.   Multiple Vitamin (MULTIVITAMIN ADULT PO) Take 1 tablet by mouth daily.   POTASSIUM CHLORIDE PO Take 20 mEq by mouth daily.   valACYclovir (VALTREX) 500 MG tablet Take 500 mg by mouth 2 (two) times daily as needed.   valsartan (DIOVAN) 320 MG tablet Take 1 tablet (320 mg total) by mouth daily.     Allergies:   Darvon [propoxyphene], Milk-related compounds, Other, Percocet [oxycodone-acetaminophen], and Tramadol   Social History   Socioeconomic History   Marital status: Divorced    Spouse name: Not on file   Number of children: Not on file   Years of education: Not on file   Highest education level: Not on file  Occupational History   Not on file  Tobacco Use   Smoking status: Never    Passive exposure: Never   Smokeless tobacco: Never  Vaping Use   Vaping Use: Never used  Substance and Sexual Activity   Alcohol use: Never   Drug use: Never   Sexual activity: Not on file  Other Topics Concern   Not on file  Social History Narrative   Not on file   Social Determinants of Health   Financial Resource Strain: Not on file  Food Insecurity: Not on file  Transportation Needs: Not on file  Physical Activity: Not on file  Stress: Not on file  Social Connections: Not on file     Family History: The patient's family history includes Colon polyps in her father and mother; Crohn's disease in her child. There is no history of Colon cancer, Esophageal cancer, Rectal cancer, or Stomach cancer.  ROS:   Please see the history of present illness.     All other systems reviewed and are negative.  EKGs/Labs/Other Studies Reviewed:    The following studies were reviewed today:  EKG:  EKG is ordered today.  EKG shows sinus rhythm   Recent Labs: 08/28/2020: ALT 20; BUN 24; Creatinine, Ser 0.93; Hemoglobin 12.8; Platelets 303; Potassium 3.4; Sodium 141 12/06/2020: TSH 3.100  Recent Lipid Panel No results found for: CHOL, TRIG, HDL, CHOLHDL, VLDL, LDLCALC,  LDLDIRECT   Risk Assessment/Calculations:          Physical Exam:    VS:  BP (!) 144/88   Pulse 84   Ht '5\' 4"'$  (1.626 m)   Wt 173 lb (78.5 kg)   SpO2 95%   BMI 29.70 kg/m     Wt Readings from Last 3 Encounters:  06/21/21 173 lb (78.5 kg)  06/11/21 165 lb (74.8 kg)  02/25/21 168 lb (76.2 kg)     GEN:  Well nourished, well developed in no acute distress HEENT: Normal NECK: No JVD; No carotid bruits LYMPHATICS: No lymphadenopathy CARDIAC: RRR, 2/6 systolic murmur RESPIRATORY:  Clear to auscultation without rales, wheezing or rhonchi  ABDOMEN: Soft, non-tender, non-distended MUSCULOSKELETAL:  No edema; No deformity  SKIN: Warm and dry NEUROLOGIC:  Alert and oriented x 3 PSYCHIATRIC:  Normal affect   ASSESSMENT:    1. Primary hypertension     PLAN:    In order of problems listed above:  Hypertension, BP still elevated.  Continue indapamide to 5 mg 1 tab daily. valsartan 320 mg twice daily.  Start amlodipine 5 mg daily.  Chronic musculoskeletal pain possibly contributing to elevated BP.  Continue to monitor log.   Follow-up in 3 months.     Medication Adjustments/Labs and Tests Ordered: Current medicines are reviewed at length with the patient today.  Concerns regarding medicines are outlined above.  Orders Placed This Encounter  Procedures   EKG 12-Lead     Meds ordered this encounter  Medications   amLODipine (NORVASC) 5 MG tablet    Sig: Take 1 tablet (5 mg total) by mouth daily.    Dispense:  30 tablet    Refill:  3      Patient Instructions  Medication Instructions:   Your physician has recommended you make the following change in your medication:    START taking Amlodipine (Norvasc) 5 MG once a day.   *If you need a refill on your cardiac medications before your next appointment, please call your pharmacy*   Follow-Up: At [facility], you and your health needs are our priority.  As part of our continuing mission to provide you with  exceptional heart care, we have created designated Provider Care Teams.  These Care Teams include your primary Cardiologist (physician) and Advanced Practice Providers (APPs -  Physician Assistants and Nurse Practitioners) who all work together to provide you with the care you need, when you need it.  We recommend signing up for the patient portal called "MyChart".  Sign up information is provided on this After Visit Summary.  MyChart is used to connect with patients for Virtual Visits (Telemedicine).  Patients are able to view lab/test results, encounter notes, upcoming appointments, etc.  Non-urgent messages can be sent to your provider as well.   To learn more about what you can do with MyChart, go to [email or web address].    Your next appointment:   3 month(s)  The format for your next appointment:   In Person  Provider:   You may see Kate Sable MD or one of the following Advanced Practice Providers on your designated Care Team:   Murray Hodgkins, NP Christell Faith, PA-C Cadence Kathlen Mody, [place]    Other Instructions   Important Information About Sugar         Signed, Kate Sable, MD  06/21/2021 4:56 PM    [place]

## 2021-06-21 NOTE — Patient Instructions (Signed)
Medication Instructions:   Your physician has recommended you make the following change in your medication:    START taking Amlodipine (Norvasc) 5 MG once a day.   *If you need a refill on your cardiac medications before your next appointment, please call your pharmacy*   Follow-Up: At [facility], you and your health needs are our priority.  As part of our continuing mission to provide you with exceptional heart care, we have created designated Provider Care Teams.  These Care Teams include your primary Cardiologist (physician) and Advanced Practice Providers (APPs -  Physician Assistants and Nurse Practitioners) who all work together to provide you with the care you need, when you need it.  We recommend signing up for the patient portal called "MyChart".  Sign up information is provided on this After Visit Summary.  MyChart is used to connect with patients for Virtual Visits (Telemedicine).  Patients are able to view lab/test results, encounter notes, upcoming appointments, etc.  Non-urgent messages can be sent to your provider as well.   To learn more about what you can do with MyChart, go to [email or web address].    Your next appointment:   3 month(s)  The format for your next appointment:   In Person  Provider:   You may see Kate Sable MD or one of the following Advanced Practice Providers on your designated Care Team:   Murray Hodgkins, NP Christell Faith, PA-C Cadence Kathlen Mody, [place]    Other Instructions   Important Information About Sugar

## 2021-06-25 DIAGNOSIS — M5382 Other specified dorsopathies, cervical region: Secondary | ICD-10-CM | POA: Diagnosis not present

## 2021-06-25 DIAGNOSIS — Z789 Other specified health status: Secondary | ICD-10-CM | POA: Diagnosis not present

## 2021-06-25 DIAGNOSIS — Z7409 Other reduced mobility: Secondary | ICD-10-CM | POA: Diagnosis not present

## 2021-06-25 DIAGNOSIS — M4802 Spinal stenosis, cervical region: Secondary | ICD-10-CM | POA: Diagnosis not present

## 2021-06-25 DIAGNOSIS — R29898 Other symptoms and signs involving the musculoskeletal system: Secondary | ICD-10-CM | POA: Diagnosis not present

## 2021-06-25 DIAGNOSIS — M542 Cervicalgia: Secondary | ICD-10-CM | POA: Diagnosis not present

## 2021-06-28 ENCOUNTER — Encounter: Payer: Self-pay | Admitting: Internal Medicine

## 2021-07-02 ENCOUNTER — Ambulatory Visit (AMBULATORY_SURGERY_CENTER): Payer: BC Managed Care – PPO | Admitting: Internal Medicine

## 2021-07-02 ENCOUNTER — Encounter: Payer: Self-pay | Admitting: Internal Medicine

## 2021-07-02 VITALS — BP 141/83 | HR 74 | Temp 96.6°F | Resp 12 | Ht 64.0 in | Wt 165.0 lb

## 2021-07-02 DIAGNOSIS — Z1211 Encounter for screening for malignant neoplasm of colon: Secondary | ICD-10-CM | POA: Diagnosis not present

## 2021-07-02 HISTORY — PX: COLONOSCOPY: SHX174

## 2021-07-02 MED ORDER — SODIUM CHLORIDE 0.9 % IV SOLN: 500.0000 mL | Freq: Once | INTRAVENOUS | Status: DC

## 2021-07-02 NOTE — Progress Notes (Signed)
HISTORY OF PRESENT ILLNESS:  Sara Lara is a 66 y.o. female who presents today for screening colonoscopy.  Previous examination 2008 was normal.  No GI complaints  REVIEW OF SYSTEMS:  All non-GI ROS negative. Past Medical History:  Diagnosis Date   Adverse reaction to COVID-19 vaccine 08/23/2020   PAC's after 4th covid vaccine pfizer   Allergy    seasonal   Anemia    "when had breast cancer,1991   Arthritis    back,toe,ankles   Cancer ([place])    right breast   Cataract    beginning stage   Heart murmur    Hypertension    Neuromuscular disorder (HCC)    nerve pain to LLE since neck surgery   PAC (premature atrial contraction)    Sciatic nerve disease     Past Surgical History:  Procedure Laterality Date   ANTERIOR CERVICAL DISCECTOMY & FUSION SINGLE LEVEL C7-T1     january 5 th 2023   BREAST LUMPECTOMY WITH AXILLARY LYMPH NODE DISSECTION Right    BREAST RECONSTRUCTION Right    bresat surgery Left    reduction   COLONOSCOPY  07/02/2021   COLONOSCOPY      Social History Sara Lara  reports that she has never smoked. She has never been exposed to tobacco smoke. She has never used smokeless tobacco. She reports that she does not drink alcohol and does not use drugs.  family history includes Colon polyps in her father and mother; Crohn's disease in her child.  Allergies  Allergen Reactions   Darvon [Propoxyphene] Nausea And Vomiting   Milk-Related Compounds Other (See Comments)   Percocet [Oxycodone-Acetaminophen] Nausea And Vomiting   Tramadol Nausea And Vomiting       PHYSICAL EXAMINATION:  Vital signs: BP (!) 195/106   Pulse 77   Temp (!) 96.6 F (35.9 C) (Temporal)   Resp 15   Ht '5\' 4"'$  (1.626 m)   Wt 165 lb (74.8 kg)   SpO2 100%   BMI 28.32 kg/m  General: Well-developed, well-nourished, no acute distress HEENT: Sclerae are anicteric, conjunctiva pink. Oral mucosa intact Lungs: Clear Heart: Regular Abdomen: soft, nontender, nondistended, no  obvious ascites, no peritoneal signs, normal bowel sounds. No organomegaly. Extremities: No edema Psychiatric: alert and oriented x3. Cooperative     ASSESSMENT:  Colon cancer screening   PLAN:  Screening colonoscopy

## 2021-07-02 NOTE — Progress Notes (Signed)
Pt's states no medical or surgical changes since previsit or office visit. 

## 2021-07-02 NOTE — Patient Instructions (Signed)
Please read handouts provided. Continue present medications. Repeat colonoscopy in 10 years for screening.   YOU HAD AN ENDOSCOPIC PROCEDURE TODAY AT [facility]:   Refer to the procedure report that was given to you for any specific questions about what was found during the examination.  If the procedure report does not answer your questions, please call your gastroenterologist to clarify.  If you requested that your care partner not be given the details of your procedure findings, then the procedure report has been included in a sealed envelope for you to review at your convenience later.  YOU SHOULD EXPECT: Some feelings of bloating in the abdomen. Passage of more gas than usual.  Walking can help get rid of the air that was put into your GI tract during the procedure and reduce the bloating. If you had a lower endoscopy (such as a colonoscopy or flexible sigmoidoscopy) you may notice spotting of blood in your stool or on the toilet paper. If you underwent a bowel prep for your procedure, you may not have a normal bowel movement for a few days.  Please Note:  You might notice some irritation and congestion in your nose or some drainage.  This is from the oxygen used during your procedure.  There is no need for concern and it should clear up in a day or so.  SYMPTOMS TO REPORT IMMEDIATELY:  Following lower endoscopy (colonoscopy or flexible sigmoidoscopy):  Excessive amounts of blood in the stool  Significant tenderness or worsening of abdominal pains  Swelling of the abdomen that is new, acute  Fever of 100F or higher   For urgent or emergent issues, a gastroenterologist can be reached at any hour by calling [phone]. Do not use MyChart messaging for urgent concerns.    DIET:  We do recommend a small meal at first, but then you may proceed to your regular diet.  Drink plenty of fluids but you should avoid alcoholic beverages for 24 hours.  ACTIVITY:  You should  plan to take it easy for the rest of today and you should NOT DRIVE or use heavy machinery until tomorrow (because of the sedation medicines used during the test).    FOLLOW UP: Our staff will call the number listed on your records 24-72 hours following your procedure to check on you and address any questions or concerns that you may have regarding the information given to you following your procedure. If we do not reach you, we will leave a message.  We will attempt to reach you two times.  During this call, we will ask if you have developed any symptoms of COVID 19. If you develop any symptoms (ie: fever, flu-like symptoms, shortness of breath, cough etc.) before then, please call [phone].  If you test positive for Covid 19 in the 2 weeks post procedure, please call and report this information to [place].    If any biopsies were taken you will be contacted by phone or by letter within the next 1-3 weeks.  Please call us at [phone] if you have not heard about the biopsies in 3 weeks.    SIGNATURES/CONFIDENTIALITY: You and/or your care partner have signed paperwork which will be entered into your electronic medical record.  These signatures attest to the fact that that the information above on your After Visit Summary has been reviewed and is understood.  Full responsibility of the confidentiality of this discharge information lies with you and/or your care-partner.

## 2021-07-02 NOTE — Progress Notes (Signed)
Vss nad trans to pacu °

## 2021-07-02 NOTE — Op Note (Signed)
[place] Patient Name: Sara Lara Procedure Date: 07/02/2021 10:34 AM MRN: [id number] Endoscopist: Docia Chuck. Henrene Pastor , MD Age: 66 Referring MD:  Date of Birth: [birth year] Gender: Female Account #: [id number] Procedure:                Colonoscopy Indications:              Screening for colorectal malignant neoplasm. Index                            examination 2008 was normal Medicines:                Monitored Anesthesia Care Procedure:                Pre-Anesthesia Assessment:                           - Prior to the procedure, a History and Physical                            was performed, and patient medications and                            allergies were reviewed. The patient's tolerance of                            previous anesthesia was also reviewed. The risks                            and benefits of the procedure and the sedation                            options and risks were discussed with the patient.                            All questions were answered, and informed consent                            was obtained. Prior Anticoagulants: The patient has                            taken no previous anticoagulant or antiplatelet                            agents. ASA Grade Assessment: II - A patient with                            mild systemic disease. After reviewing the risks                            and benefits, the patient was deemed in                            satisfactory condition to undergo the procedure.  After obtaining informed consent, the colonoscope                            was passed under direct vision. Throughout the                            procedure, the patient's blood pressure, pulse, and                            oxygen saturations were monitored continuously. The                            CF [id number] #[id number] was introduced through the anus                            and advanced to the the cecum,  identified by                            appendiceal orifice and ileocecal valve. The                            ileocecal valve, appendiceal orifice, and rectum                            were photographed. The quality of the bowel                            preparation was excellent. The colonoscopy was                            performed without difficulty. The patient tolerated                            the procedure well. The bowel preparation used was                            SUPREP via split dose instruction. Scope In: 10:55:50 AM Scope Out: 11:10:13 AM Scope Withdrawal Time: 0 hours 9 minutes 45 seconds  Total Procedure Duration: 0 hours 14 minutes 23 seconds  Findings:                 The entire examined colon appeared normal on direct                            and retroflexion views. Complications:            No immediate complications. Estimated blood loss:                            None. Estimated Blood Loss:     Estimated blood loss: none. Impression:               - The entire examined colon is normal on direct and  retroflexion views.                           - No specimens collected. Recommendation:           - Repeat colonoscopy in 10 years for screening                            purposes.                           - Patient has a contact number available for                            emergencies. The signs and symptoms of potential                            delayed complications were discussed with the                            patient. Return to normal activities tomorrow.                            Written discharge instructions were provided to the                            patient.                           - Resume previous diet.                           - Continue present medications. Docia Chuck. Henrene Pastor, MD 07/02/2021 11:14:38 AM This report has been signed electronically.

## 2021-07-03 ENCOUNTER — Telehealth: Payer: Self-pay

## 2021-07-03 NOTE — Telephone Encounter (Signed)
Called [phone] and left a message we tried to reach pt for a follow up call. maw

## 2021-07-03 NOTE — Telephone Encounter (Signed)
  Follow up Call-     07/02/2021    9:45 AM  Call back number  Post procedure Call Back phone  # [phone]  Permission to leave phone message Yes     Patient questions:  Do you have a fever, pain , or abdominal swelling? No. Pain Score  0 *  Have you tolerated food without any problems? Yes.    Have you been able to return to your normal activities? Yes.    Do you have any questions about your discharge instructions: Diet   No. Medications  No. Follow up visit  No.  Do you have questions or concerns about your Care? No.  Actions: * If pain score is 4 or above: No action needed, pain <4.

## 2021-07-16 DIAGNOSIS — E559 Vitamin D deficiency, unspecified: Secondary | ICD-10-CM | POA: Diagnosis not present

## 2021-07-16 DIAGNOSIS — R531 Weakness: Secondary | ICD-10-CM | POA: Diagnosis not present

## 2021-07-16 DIAGNOSIS — Z1331 Encounter for screening for depression: Secondary | ICD-10-CM | POA: Diagnosis not present

## 2021-07-16 DIAGNOSIS — I10 Essential (primary) hypertension: Secondary | ICD-10-CM | POA: Diagnosis not present

## 2021-07-16 DIAGNOSIS — E785 Hyperlipidemia, unspecified: Secondary | ICD-10-CM | POA: Diagnosis not present

## 2021-08-05 DIAGNOSIS — M542 Cervicalgia: Secondary | ICD-10-CM | POA: Diagnosis not present

## 2021-08-05 DIAGNOSIS — M5416 Radiculopathy, lumbar region: Secondary | ICD-10-CM | POA: Diagnosis not present

## 2021-08-05 DIAGNOSIS — Z981 Arthrodesis status: Secondary | ICD-10-CM | POA: Diagnosis not present

## 2021-08-05 DIAGNOSIS — M25519 Pain in unspecified shoulder: Secondary | ICD-10-CM | POA: Diagnosis not present

## 2021-08-12 ENCOUNTER — Telehealth: Payer: Self-pay | Admitting: *Deleted

## 2021-08-12 NOTE — Chronic Care Management (AMB) (Unsigned)
  Care Coordination  Note  08/12/2021 Name: Sara Lara MRN: [id number] DOB: [birth year]  Sara Lara is a 66 y.o. year old female who is a primary care patient of Lois Huxley, [place]. I reached out to Sara Lara by phone today to offer care coordination services.       Follow up plan: Unsuccessful telephone outreach attempt made. A HIPAA compliant phone message was left for the patient providing contact information and requesting a return call.  The care guide will reach out to the patient again over the next 7 days.  If patient calls provider office to request assistance with care coordination needs, please contact the care guide at the number below.   [place]  Direct Dial: [phone]

## 2021-08-14 NOTE — Chronic Care Management (AMB) (Signed)
  Care Coordination  Note  08/14/2021 Name: [name] MRN: [id number] DOB: 01/30/1955  Sara Lara is a 66 y.o. year old female who is a primary care patient of Lois Huxley, [place]. I reached out to Sara Lara by phone today to offer care coordination services.      Ms. Amrein was given information about Care Coordination services today including:  The Care Coordination services include support from the care team which includes your Nurse Coordinator, Clinical Social Worker, or Pharmacist.  The Care Coordination team is here to help remove barriers to the health concerns and goals most important to you. Care Coordination services are voluntary and the patient may decline or stop services at any time by request to their care team member.   Patient agreed to services and verbal consent obtained.   Follow up plan: Telephone appointment with care coordination team member scheduled for:08/20/21  [place]  Direct Dial: [phone]

## 2021-08-20 ENCOUNTER — Ambulatory Visit: Payer: Self-pay

## 2021-08-20 NOTE — Patient Outreach (Signed)
  Care Coordination   Initial Visit Note   08/20/2021 Name: [name] MRN: [id number] DOB: [birth year]  Sara Lara is a 66 y.o. year old female who sees Lois Huxley, [place] for primary care.   Telephone outreach was unsuccessful. A HIPPA compliant phone message was left for the patient providing contact information and requesting a return call.       Goals Addressed   None     SDOH assessments and interventions completed:  No     Care Coordination Interventions Activated:  No  Care Coordination Interventions:  No, not indicated  Follow up plan: Follow up call scheduled for 08/22/21    Encounter Outcome:  Pt. Visit Completed    Lazaro Arms RN, BSN, [facility]   Phone: [phone]

## 2021-08-22 ENCOUNTER — Ambulatory Visit: Payer: Self-pay

## 2021-08-22 NOTE — Patient Outreach (Signed)
  Care Coordination   08/22/2021 Name: Sara Lara MRN: [id number] DOB: Oct 14, 1955   Care Coordination Outreach Attempts:  A second unsuccessful outreach was attempted today to offer the patient with information about available care coordination services as a benefit of their health plan.     Follow Up Plan:  Additional outreach attempts will be made to offer the patient care coordination information and services.   Encounter Outcome:  No Answer  Care Coordination Interventions Activated:  No   Care Coordination Interventions:  No, not indicated    Lazaro Arms RN, BSN, [facility]   Phone: [phone]

## 2021-08-23 ENCOUNTER — Ambulatory Visit: Payer: Self-pay

## 2021-08-23 NOTE — Patient Outreach (Signed)
  Care Coordination   08/23/2021 Name: Sara Sara MRN: [id number] DOB: 31-Jul-1955   Care Coordination Outreach Attempts:  A third unsuccessful outreach was attempted today to offer the patient with information about available care coordination services as a benefit of their health plan.   Follow Up Plan:  No further outreach attempts will be made at this time. We have been unable to contact the patient to offer or enroll patient in care coordination services  Encounter Outcome:  No Answer  Care Coordination Interventions Activated:  No   Care Coordination Interventions:  No, not indicated    Lazaro Arms RN, BSN, [facility]   Phone: [phone]

## 2021-08-23 NOTE — Patient Outreach (Signed)
  Care Coordination   Initial Visit Note   08/23/2021 Name: Sara Lara MRN: [id number] DOB: 1955/03/18  Sara Lara is a 66 y.o. year old female who sees Sara Lara, [place] for primary care. I spoke with  Sara Lara by phone today  What matters to the patients health and wellness today?  The patient stated She has no needs or concerns.    Goals Addressed             This Visit's Progress    COMPLETED: [facility] CoordinationActivites-nofollowup required                 SDOH assessments and interventions completed:  Yes  SDOH Interventions Today    Flowsheet Row Most Recent Value  SDOH Interventions   Food Insecurity Interventions Intervention Not Indicated  Housing Interventions Intervention Not Indicated  Transportation Interventions Intervention Not Indicated        Care Coordination Interventions Activated:  No  Care Coordination Interventions:  No, not indicated   Follow up plan: No further intervention required.   Encounter Outcome:  Pt. Visit Completed   Sara Arms RN, BSN, [facility]   Phone: [phone]

## 2021-08-23 NOTE — Patient Instructions (Signed)
Visit Information  Thank you for taking time to visit with me today. Please don't hesitate to contact me if I can be of assistance to you.   Following are the goals we discussed today:   Goals Addressed             This Visit's Progress    COMPLETED: [facility] CoordinationActivites-nofollowup required                 Patient verbalizes understanding of instructions and care plan provided today and agrees to view in [place]. Active MyChart status and patient understanding of how to access instructions and care plan via MyChart confirmed with patient.     Lazaro Arms RN, BSN, [facility]   Phone: [phone]

## 2021-09-25 DIAGNOSIS — I491 Atrial premature depolarization: Secondary | ICD-10-CM | POA: Diagnosis not present

## 2021-09-25 DIAGNOSIS — R2689 Other abnormalities of gait and mobility: Secondary | ICD-10-CM | POA: Diagnosis not present

## 2021-09-25 DIAGNOSIS — M79602 Pain in left arm: Secondary | ICD-10-CM | POA: Diagnosis not present

## 2021-09-25 DIAGNOSIS — M4802 Spinal stenosis, cervical region: Secondary | ICD-10-CM | POA: Diagnosis not present

## 2021-09-25 DIAGNOSIS — R202 Paresthesia of skin: Secondary | ICD-10-CM | POA: Diagnosis not present

## 2021-09-25 DIAGNOSIS — Z118 Encounter for screening for other infectious and parasitic diseases: Secondary | ICD-10-CM | POA: Diagnosis not present

## 2021-09-25 DIAGNOSIS — M79601 Pain in right arm: Secondary | ICD-10-CM | POA: Diagnosis not present

## 2021-09-25 DIAGNOSIS — Z114 Encounter for screening for human immunodeficiency virus [HIV]: Secondary | ICD-10-CM | POA: Diagnosis not present

## 2021-09-27 ENCOUNTER — Ambulatory Visit: Payer: BC Managed Care – PPO | Attending: Cardiology | Admitting: Cardiology

## 2021-09-27 ENCOUNTER — Encounter: Payer: Self-pay | Admitting: Cardiology

## 2021-09-27 VITALS — BP 160/100 | HR 88 | Ht 64.0 in | Wt 177.2 lb

## 2021-09-27 DIAGNOSIS — I10 Essential (primary) hypertension: Secondary | ICD-10-CM

## 2021-09-27 NOTE — Patient Instructions (Signed)
Medication Instructions:   Your physician recommends that you continue on your current medications as directed. Please refer to the Current Medication list given to you today.  *If you need a refill on your cardiac medications before your next appointment, please call your pharmacy*    Follow-Up: At [facility], you and your health needs are our priority.  As part of our continuing mission to provide you with exceptional heart care, we have created designated Provider Care Teams.  These Care Teams include your primary Cardiologist (physician) and Advanced Practice Providers (APPs -  Physician Assistants and Nurse Practitioners) who all work together to provide you with the care you need, when you need it.  We recommend signing up for the patient portal called "MyChart".  Sign up information is provided on this After Visit Summary.  MyChart is used to connect with patients for Virtual Visits (Telemedicine).  Patients are able to view lab/test results, encounter notes, upcoming appointments, etc.  Non-urgent messages can be sent to your provider as well.   To learn more about what you can do with MyChart, go to [email or web address].    Your next appointment:   2 month(s)  The format for your next appointment:   In Person  Provider:   You may see Kate Sable, MD or one of the following Advanced Practice Providers on your designated Care Team:   Murray Hodgkins, NP Christell Faith, PA-C Cadence Kathlen Mody, PA-C Gerrie Nordmann, NP     Important Information About Sugar

## 2021-09-27 NOTE — Progress Notes (Signed)
Cardiology Office Note:    Date:  09/27/2021   ID:  Sara, Lara 1955/03/30, MRN [id number]  PCP:  Sara Huxley, PA   [facility] Providers Cardiologist:  None     Referring MD: Sara Huxley, PA   Chief Complaint  Patient presents with   Follow-up    3 month follow up. Meds reviewed with patient.     History of Present Illness:    Sara Lara is a 66 y.o. female with a hx of hypertension, chronic neck pain who presents for follow-up.    Being seen for hypertension and medication management, previously started on amlodipine 5 mg for BP control.  Compliant with indapamide, valsartan as prescribed.  She still has chronic back pain, nerve pain radiating down left leg.  Also undergoing multiple stresses at home with adult son.  Blood pressures are usually better controlled compared to today.  Prior notes Echocardiogram 11/2020 EF 60 to 60%, impaired mild MR   Past Medical History:  Diagnosis Date   Adverse reaction to COVID-19 vaccine 08/23/2020   PAC's after 4th covid vaccine pfizer   Allergy    seasonal   Anemia    "when had breast cancer,1991   Arthritis    back,toe,ankles   Cancer ([place])    right breast   Cataract    beginning stage   Heart murmur    Hypertension    Neuromuscular disorder (HCC)    nerve pain to LLE since neck surgery   PAC (premature atrial contraction)    Sciatic nerve disease     Past Surgical History:  Procedure Laterality Date   ANTERIOR CERVICAL DISCECTOMY & FUSION SINGLE LEVEL C7-T1     january 5 th 2023   BREAST LUMPECTOMY WITH AXILLARY LYMPH NODE DISSECTION Right    BREAST RECONSTRUCTION Right    bresat surgery Left    reduction   COLONOSCOPY  07/02/2021   COLONOSCOPY      Current Medications: Current Meds  Medication Sig   acetaminophen (TYLENOL) 325 MG tablet Take 650 mg by mouth every 6 (six) hours as needed.   amLODipine (NORVASC) 5 MG tablet Take 1 tablet (5 mg total) by mouth daily.   Calcium Carbonate  (CALCIUM 600 PO) Take 600 mg by mouth 2 (two) times daily.   fluticasone (FLONASE) 50 MCG/ACT nasal spray Place into both nostrils as needed.   gabapentin (NEURONTIN) 300 MG capsule Take 2 capsules (600 mg total) by mouth 3 (three) times daily.   MAGNESIUM PO Take by mouth daily.   Multiple Vitamin (MULTIVITAMIN ADULT PO) Take 1 tablet by mouth daily.   POTASSIUM CHLORIDE PO Take 20 mEq by mouth daily.   valACYclovir (VALTREX) 500 MG tablet Take 500 mg by mouth 2 (two) times daily as needed.   valsartan (DIOVAN) 320 MG tablet Take 1 tablet (320 mg total) by mouth daily.     Allergies:   Darvon [propoxyphene], Milk-related compounds, Percocet [oxycodone-acetaminophen], and Tramadol   Social History   Socioeconomic History   Marital status: Divorced    Spouse name: Not on file   Number of children: Not on file   Years of education: Not on file   Highest education level: Not on file  Occupational History   Not on file  Tobacco Use   Smoking status: Never    Passive exposure: Never   Smokeless tobacco: Never  Vaping Use   Vaping Use: Never used  Substance and Sexual Activity   Alcohol use:  Never   Drug use: Never   Sexual activity: Not Currently    Birth control/protection: Post-menopausal  Other Topics Concern   Not on file  Social History Narrative   Not on file   Social Determinants of Health   Financial Resource Strain: Not on file  Food Insecurity: Not on file  Transportation Needs: Not on file  Physical Activity: Not on file  Stress: Not on file  Social Connections: Not on file     Family History: The patient's family history includes Colon polyps in her father and mother; Crohn's disease in her child. There is no history of Colon cancer, Esophageal cancer, Rectal cancer, or Stomach cancer.  ROS:   Please see the history of present illness.     All other systems reviewed and are negative.  EKGs/Labs/Other Studies Reviewed:    The following studies were  reviewed today:  EKG:  EKG not ordered today.  Recent Labs: 12/06/2020: TSH 3.100  Recent Lipid Panel No results found for: "CHOL", "TRIG", "HDL", "CHOLHDL", "VLDL", "LDLCALC", "LDLDIRECT"   Risk Assessment/Calculations:          Physical Exam:    VS:  BP (!) 160/100 (BP Location: Left Arm, Patient Position: Sitting, Cuff Size: Large)   Pulse 88   Ht '5\' 4"'$  (1.626 m)   Wt 177 lb 3.2 oz (80.4 kg)   SpO2 99%   BMI 30.42 kg/m     Wt Readings from Last 3 Encounters:  09/27/21 177 lb 3.2 oz (80.4 kg)  07/02/21 165 lb (74.8 kg)  06/21/21 173 lb (78.5 kg)     GEN:  Well nourished, well developed in no acute distress HEENT: Normal NECK: No JVD; No carotid bruits CARDIAC: RRR, 2/6 systolic murmur RESPIRATORY:  Clear to auscultation without rales, wheezing or rhonchi  ABDOMEN: Soft, non-tender, non-distended MUSCULOSKELETAL:  No edema; No deformity  SKIN: Warm and dry NEUROLOGIC:  Alert and oriented x 3 PSYCHIATRIC:  Normal affect   ASSESSMENT:    1. Primary hypertension    PLAN:    In order of problems listed above:  Hypertension, BP still elevated.  Patient would like to try eating healthier, weight loss prior to increasing medications.  Continue indapamide 5 mg daily. valsartan 320 mg daily, Norvasc 5 mg daily.  If BP elevated at follow-up visit, plan to increase Norvasc to 10 mg daily. chronic musculoskeletal pain possibly contributing to elevated BP.  Advised to measure BP at home and keep log.  Follow-up in 2 months.     Medication Adjustments/Labs and Tests Ordered: Current medicines are reviewed at length with the patient today.  Concerns regarding medicines are outlined above.  No orders of the defined types were placed in this encounter.    No orders of the defined types were placed in this encounter.     Patient Instructions  Medication Instructions:   Your physician recommends that you continue on your current medications as directed. Please  refer to the Current Medication list given to you today.  *If you need a refill on your cardiac medications before your next appointment, please call your pharmacy*    Follow-Up: At [facility], you and your health needs are our priority.  As part of our continuing mission to provide you with exceptional heart care, we have created designated Provider Care Teams.  These Care Teams include your primary Cardiologist (physician) and Advanced Practice Providers (APPs -  Physician Assistants and Nurse Practitioners) who all work together to provide you with  the care you need, when you need it.  We recommend signing up for the patient portal called "MyChart".  Sign up information is provided on this After Visit Summary.  MyChart is used to connect with patients for Virtual Visits (Telemedicine).  Patients are able to view lab/test results, encounter notes, upcoming appointments, etc.  Non-urgent messages can be sent to your provider as well.   To learn more about what you can do with MyChart, go to [email or web address].    Your next appointment:   2 month(s)  The format for your next appointment:   In Person  Provider:   You may see Kate Sable, MD or one of the following Advanced Practice Providers on your designated Care Team:   Murray Hodgkins, NP Christell Faith, PA-C Cadence Kathlen Mody, PA-C Gerrie Nordmann, NP     Important Information About Sugar         Signed, Kate Sable, MD  09/27/2021 5:05 PM    [place]

## 2021-09-30 ENCOUNTER — Encounter: Payer: Self-pay | Admitting: Cardiology

## 2021-10-01 ENCOUNTER — Other Ambulatory Visit: Payer: Self-pay

## 2021-10-01 MED ORDER — CARVEDILOL 12.5 MG PO TABS: 12.5000 mg | ORAL_TABLET | Freq: Two times a day (BID) | ORAL | 3 refills | Status: DC

## 2021-10-01 NOTE — Progress Notes (Signed)
Medication changes per MyChart and Dr. Thereasa Solo recommendations as seen below:  Stop amlodipine due to edema, start carvedilol 12.5 mg twice daily.  Continue indapamide, valsartan as prescribed.  Low-salt diet advised.

## 2021-10-16 DIAGNOSIS — I10 Essential (primary) hypertension: Secondary | ICD-10-CM | POA: Diagnosis not present

## 2021-10-16 DIAGNOSIS — D72819 Decreased white blood cell count, unspecified: Secondary | ICD-10-CM | POA: Diagnosis not present

## 2021-10-17 ENCOUNTER — Telehealth: Payer: Self-pay | Admitting: Oncology

## 2021-10-17 NOTE — Telephone Encounter (Signed)
Scheduled appt per 9/28 referral. Pt is aware of appt date and time. Pt is aware to arrive 15 mins prior to appt time and to bring and updated insurance card. Pt is aware of appt location.   

## 2021-11-06 NOTE — Progress Notes (Signed)
[facility] Initial Visit:  Patient Care Team: Lois Huxley, PA as PCP - General (Family Medicine) Barbee Cough, MD as Attending Physician (Hematology)  CHIEF COMPLAINTS/PURPOSE OF CONSULTATION:  Oncology History   No history exists.    HISTORY OF PRESENTING ILLNESS: Sara Lara 66 y.o. female is here because of leukopenia Medical history notable for hypertension, HSV-2, breast cancer diagnosed in 1991, vitamin D deficiency Has been evaluated at [facility] for neurologic problems  October 15, 2021: WBC 3.3 hemoglobin 12.3 MCV 94 platelet count 158; 48 segs 43 lymphs 7 monos 2 eos 1 basophil.  ANC 1.58 Chemistry is notable for calcium 10.9 albumin 4.6 alk phos 112. B12 485 folate 18.9 HIV negative  ESR 18  November 08 2021:  [facility] Hematology Consult Patient states that the breast cancer diagnosed in 1991 involved the right breast and was treated with lumpectomy and ultimately mastectomy and LN disection.  She received anthracycline based chemotherapy but no radiation followed by tamoxifen x 3.5 yrs.  The TAM was stopped early due because patient was concerned about potential side effects.  Last mammogram was performed in May 2023 at [facility] office.    Social:  Married.  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  Tobacco none.  EtOH none  [facility] Mother died 26 MI Father died 74 kidney failure Brother x 6.  Eldest two have medical issues related to serving in [place] One with ESRD on HD   Review of Systems  Constitutional:  Positive for fever and unexpected weight change. Negative for appetite change, chills and fatigue.       Has gained 20 lbs over 10 months due to increased mobility  HENT:   Negative for hearing loss, lump/mass, mouth sores, nosebleeds, sore throat, tinnitus, trouble swallowing and voice change.   Eyes:  Negative for eye problems and icterus.       Vision changes:  None  Respiratory:  Positive for cough. Negative for  chest tightness, hemoptysis and shortness of breath.        Slight cough due to seasonal allergies and post nasal gtt  Cardiovascular:  Positive for palpitations. Negative for chest pain and leg swelling.       Has PAC's which was found after patient received second COVID booster Developed LE edema with amlodipine  Gastrointestinal:  Negative for abdominal pain, blood in stool, constipation, diarrhea, nausea and vomiting.  Endocrine: Negative for hot flashes.       Cold intolerance:  none Heat intolerance:  none  Genitourinary:  Negative for difficulty urinating, dysuria, frequency and hematuria.   Musculoskeletal:  Positive for gait problem, neck pain and neck stiffness. Negative for arthralgias, back pain and myalgias.  Skin:  Negative for itching, rash and wound.  Neurological:  Positive for extremity weakness, gait problem and numbness. Negative for dizziness, headaches and light-headedness.       Developed gait problems and chest wall pain and left axillary pain following COVID booster.    Hematological:  Negative for adenopathy. Does not bruise/bleed easily.  Psychiatric/Behavioral:  Positive for sleep disturbance. Negative for suicidal ideas. The patient is not nervous/anxious.     MEDICAL HISTORY: Past Medical History:  Diagnosis Date   Adverse reaction to COVID-19 vaccine 08/23/2020   PAC's after 4th covid vaccine pfizer   Allergy    seasonal   Anemia    "when had breast cancer,1991   Arthritis    back,toe,ankles   Cancer ([place])  right breast   Cataract    beginning stage   Heart murmur    Hypertension    Neuromuscular disorder (HCC)    nerve pain to LLE since neck surgery   PAC (premature atrial contraction)    Sciatic nerve disease     SURGICAL HISTORY: Past Surgical History:  Procedure Laterality Date   ANTERIOR CERVICAL DISCECTOMY & FUSION SINGLE LEVEL C7-T1     january 5 th 2023   BREAST LUMPECTOMY WITH AXILLARY LYMPH NODE DISSECTION Right    BREAST  RECONSTRUCTION Right    bresat surgery Left    reduction   COLONOSCOPY  07/02/2021   COLONOSCOPY      SOCIAL HISTORY: Social History   Socioeconomic History   Marital status: Divorced    Spouse name: Not on file   Number of children: Not on file   Years of education: Not on file   Highest education level: Not on file  Occupational History   Not on file  Tobacco Use   Smoking status: Never    Passive exposure: Never   Smokeless tobacco: Never  Vaping Use   Vaping Use: Never used  Substance and Sexual Activity   Alcohol use: Never   Drug use: Never   Sexual activity: Not Currently    Birth control/protection: Post-menopausal  Other Topics Concern   Not on file  Social History Narrative   Not on file   Social Determinants of Health   Financial Resource Strain: Not on file  Food Insecurity: Not on file  Transportation Needs: Not on file  Physical Activity: Not on file  Stress: Not on file  Social Connections: Not on file  Intimate Partner Violence: Not on file    FAMILY HISTORY Family History  Problem Relation Age of Onset   Colon polyps Mother    Colon polyps Father    Crohn's disease Child    Colon cancer Neg Hx    Esophageal cancer Neg Hx    Rectal cancer Neg Hx    Stomach cancer Neg Hx     ALLERGIES:  is allergic to darvon [propoxyphene], milk-related compounds, percocet [oxycodone-acetaminophen], and tramadol.  MEDICATIONS:  Current Outpatient Medications  Medication Sig Dispense Refill   acetaminophen (TYLENOL) 325 MG tablet Take 650 mg by mouth every 6 (six) hours as needed.     Calcium Carbonate (CALCIUM 600 PO) Take 600 mg by mouth 2 (two) times daily.     carvedilol (COREG) 12.5 MG tablet Take 1 tablet (12.5 mg total) by mouth 2 (two) times daily. 60 tablet 3   fluticasone (FLONASE) 50 MCG/ACT nasal spray Place into both nostrils as needed.     gabapentin (NEURONTIN) 300 MG capsule Take 2 capsules (600 mg total) by mouth 3 (three) times daily.  180 capsule 6   indapamide (LOZOL) 2.5 MG tablet Take 2 tablets (5 mg total) by mouth daily. (Patient not taking: Reported on 09/27/2021) 30 tablet 3   MAGNESIUM PO Take by mouth daily.     Multiple Vitamin (MULTIVITAMIN ADULT PO) Take 1 tablet by mouth daily.     POTASSIUM CHLORIDE PO Take 20 mEq by mouth daily.     valACYclovir (VALTREX) 500 MG tablet Take 500 mg by mouth 2 (two) times daily as needed.     valsartan (DIOVAN) 320 MG tablet Take 1 tablet (320 mg total) by mouth daily. 90 tablet 3   No current facility-administered medications for this visit.    PHYSICAL EXAMINATION:  ECOG PERFORMANCE STATUS: 1 -  Symptomatic but completely ambulatory   Vitals:   11/08/21 1529  BP: (!) 179/87  Pulse: 77  Resp: 15  Temp: 98.7 F (37.1 C)  SpO2: 100%    Filed Weights   11/08/21 1529  Weight: 177 lb 12.8 oz (80.6 kg)     Physical Exam Vitals and nursing note reviewed.  Constitutional:      Appearance: Normal appearance. She is not toxic-appearing or diaphoretic.  HENT:     Head: Normocephalic and atraumatic.     Right Ear: External ear normal.     Left Ear: External ear normal.     Nose: Nose normal. No congestion or rhinorrhea.  Eyes:     General: No scleral icterus.    Extraocular Movements: Extraocular movements intact.     Conjunctiva/sclera: Conjunctivae normal.     Pupils: Pupils are equal, round, and reactive to light.  Cardiovascular:     Rate and Rhythm: Normal rate.     Heart sounds: No murmur heard.    No friction rub. No gallop.  Abdominal:     General: Bowel sounds are normal.     Palpations: Abdomen is soft.  Musculoskeletal:        General: No swelling, tenderness or deformity.     Cervical back: Normal range of motion and neck supple. No rigidity or tenderness.  Lymphadenopathy:     Head:     Right side of head: No submental, submandibular, tonsillar, preauricular, posterior auricular or occipital adenopathy.     Left side of head: No submental,  submandibular, tonsillar, preauricular, posterior auricular or occipital adenopathy.     Cervical: No cervical adenopathy.     Right cervical: No superficial, deep or posterior cervical adenopathy.    Left cervical: No superficial, deep or posterior cervical adenopathy.     Upper Body:     Right upper body: No supraclavicular, axillary, pectoral or epitrochlear adenopathy.     Left upper body: No supraclavicular, axillary, pectoral or epitrochlear adenopathy.  Skin:    General: Skin is warm.     Coloration: Skin is not jaundiced.  Neurological:     General: No focal deficit present.     Mental Status: She is alert and oriented to person, place, and time. Mental status is at baseline.     Cranial Nerves: No cranial nerve deficit.  Psychiatric:        Mood and Affect: Mood normal.        Behavior: Behavior normal.        Thought Content: Thought content normal.        Judgment: Judgment normal.     LABORATORY DATA: I have personally reviewed the data as listed:  Orders Only on 11/08/2021  Component Date Value Ref Range Status   ds DNA Ab 11/08/2021 <1  0 - 9 IU/mL Final   Comment: (NOTE)                                   Negative      <5                                   Equivocal  5 - 9  Positive      >9    Ribonucleic Protein 11/08/2021 <0.2  0.0 - 0.9 AI Final   ENA SM Ab Ser-aCnc 11/08/2021 <0.2  0.0 - 0.9 AI Final   Scleroderma (Scl-70) (ENA) Antibod* 11/08/2021 <0.2  0.0 - 0.9 AI Final   SSA (Ro) (ENA) Antibody, IgG 11/08/2021 <0.2  0.0 - 0.9 AI Final   SSB (La) (ENA) Antibody, IgG 11/08/2021 <0.2  0.0 - 0.9 AI Final   Chromatin Ab SerPl-aCnc 11/08/2021 <0.2  0.0 - 0.9 AI Final   Anti JO-1 11/08/2021 <0.2  0.0 - 0.9 AI Final   Centromere Ab Screen 11/08/2021 <0.2  0.0 - 0.9 AI Final   See below: 11/08/2021 Comment   Final   Comment: (NOTE) Pattern              Potential Disease Association -------------   --------------------------------------------- Homogeneous    Systemic Lupus Erythematosus, Drug Induced               Systemic Lupus Erythematosus, Chronic               Autoimmune hepatitis, Juvenile Idiopathic               Arthritis -------------  --------------------------------------------- Speckled       Sjogren Syndrome, Systemic Lupus               Erythematosus, Subacute Cutaneous Lupus,               Neonatal Lupus, Congenital Heart Block,               Mixed Connective Tissue Disease,               Scleroderma-diffuse, Scleroderma-Autoimmune               Myositis Overlap Syndrome, Systemic Lupus               Erythematosus-Scleroderma-Autoimmune               Myositis Overlap Syndrome, Systemic               Autoimmune Rheumatic Disease,               Undifferentiated Connective Tissue Disease -------------  --------------------------------------------- Nucleolar      Systemic Sclero                          sis, Scleroderma-Autoimmune               Myositis Overlap Syndrome, Sjogren               Syndrome, Raynaud phenomenon, Pulmonary               Arterial Hypertension, Systemic Autoimmune               Rheumatic Disease, Cancer -------------  --------------------------------------------- Centromere     Scleroderma-CREST, Limited Cutaneous SSc,               Raynaud's Phenomenon, Primary Biliary               Cholangitis -------------  --------------------------------------------- Nuclear Dot    Primary Biliary Cholangitis -------------  --------------------------------------------- Nuclear        Primary Biliary Cholangitis, Autoimmune Membrane       Hepatitis/Liver disease, Systemic Autoimmune               Rheumatic Disease, Autoimmune Cytopenias,  Linear Scleroderma, Antiphospholipid Syndrome -------------  --------------------------------------------- Performed At: [facility] [place], [place] [id number] Rush Farmer MD  Ph:8                          [id number]    WBC MORPHOLOGY 11/08/2021 MORPHOLOGY UNREMARKABLE   Final   RBC MORPHOLOGY 11/08/2021 MORPHOLOGY UNREMARKABLE   Final   Plt Morphology 11/08/2021 MORPHOLOGY UNREMARKABLE   Final   Clinical Information 11/08/2021 neutropenia   Final   Performed at [facility], [place] [address] [place], [place] [address]  Appointment on 11/08/2021  Component Date Value Ref Range Status   Rhuematoid fact SerPl-aCnc 11/08/2021 <10.0  <14.0 IU/mL Final   Comment: (NOTE) Performed At: [facility] [place], [place] [id number] Rush Farmer MD [id number]    Copper 11/08/2021 137  80 - 158 ug/dL Final   Comment: (NOTE) This test was developed and its performance characteristics determined by [facility]. It has not been cleared or approved by the Food and Drug Administration.                                Detection Limit = 5 Performed At: [facility] [place], [place] [id number] Rush Farmer MD [id number]    Vitamin B-12 11/08/2021 1,525 (H)  180 - 914 pg/mL Final   Comment: RESULTS CONFIRMED BY MANUAL DILUTION (NOTE) This assay is not validated for testing neonatal or myeloproliferative syndrome specimens for Vitamin B12 levels. Performed at [facility], [place] [address], [place], [place] [address]    Folate 11/08/2021 12.6  >5.9 ng/mL Final   Performed at [facility], [place] [address] [place], [place] [address]   Sodium 11/08/2021 141  135 - 145 mmol/L Final   Potassium 11/08/2021 3.4 (L)  3.5 - 5.1 mmol/L Final   Chloride 11/08/2021 105  98 - 111 mmol/L Final   CO2 11/08/2021 30  22 - 32 mmol/L Final   Glucose, Bld 11/08/2021 87  70 - 99 mg/dL Final   Glucose reference range applies only to samples taken after fasting for at least 8 hours.   BUN 11/08/2021 17  8 - 23 mg/dL Final   Creatinine 11/08/2021 0.97  0.44 - 1.00 mg/dL Final   Calcium  11/08/2021 10.5 (H)  8.9 - 10.3 mg/dL Final   Total Protein 11/08/2021 8.0  6.5 - 8.1 g/dL Final   Albumin 11/08/2021 4.6  3.5 - 5.0 g/dL Final   AST 11/08/2021 23  15 - 41 U/L Final   ALT 11/08/2021 15  0 - 44 U/L Final   Alkaline Phosphatase 11/08/2021 114  38 - 126 U/L Final   Total Bilirubin 11/08/2021 0.3  0.3 - 1.2 mg/dL Final   GFR, Estimated 11/08/2021 >60  >60 mL/min Final   Comment: (NOTE) Calculated using the CKD-EPI Creatinine Equation (2021)    Anion gap 11/08/2021 6  5 - 15 Final   Performed at [facility], [place] [address], [place], [place] [address]   WBC Count 11/08/2021 4.4  4.0 - 10.5 K/uL Final   RBC 11/08/2021 4.00  3.87 - 5.11 MIL/uL Final   Hemoglobin 11/08/2021 12.1  12.0 - 15.0 g/dL Final   HCT 11/08/2021 37.7  36.0 - 46.0 % Final   MCV 11/08/2021 94.3  80.0 - 100.0 fL Final   [facility] 11/08/2021  30.3  26.0 - 34.0 pg Final   MCHC 11/08/2021 32.1  30.0 - 36.0 g/dL Final   RDW 11/08/2021 12.6  11.5 - 15.5 % Final   Platelet Count 11/08/2021 260  150 - 400 K/uL Final   nRBC 11/08/2021 0.0  0.0 - 0.2 % Final   Neutrophils Relative % 11/08/2021 48  % Final   Neutro Abs 11/08/2021 2.1  1.7 - 7.7 K/uL Final   Lymphocytes Relative 11/08/2021 43  % Final   Lymphs Abs 11/08/2021 1.9  0.7 - 4.0 K/uL Final   Monocytes Relative 11/08/2021 6  % Final   Monocytes Absolute 11/08/2021 0.3  0.1 - 1.0 K/uL Final   Eosinophils Relative 11/08/2021 2  % Final   Eosinophils Absolute 11/08/2021 0.1  0.0 - 0.5 K/uL Final   Basophils Relative 11/08/2021 1  % Final   Basophils Absolute 11/08/2021 0.0  0.0 - 0.1 K/uL Final   Immature Granulocytes 11/08/2021 0  % Final   Abs Immature Granulocytes 11/08/2021 0.01  0.00 - 0.07 K/uL Final   Performed at [facility], [place] [address] [place], [place] [address]    RADIOGRAPHIC STUDIES: I have personally reviewed the radiological images as listed and agree with the findings in the report  No  results found.  ASSESSMENT/PLAN  66 y.o. with medical history notable for hypertension, HSV-2, breast cancer diagnosed in 1991, vitamin D deficiency.  Patient seen for evaluation of mild neutropenia  Neutropenia::  mild and without complication.  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   Will obtain CBC with diff, CMP, ANA panel, RF, B12 and folate   Benign ethnic neutropenia:  Describes the phenotype of having an absolute neutrophil count (ANC) <1500 cells/mL with no increased risk of infection. It is  most commonly seen in those of African ancestry.  [place] reference ranges from countries in [place] emphasize that [place] levels <1500 cells/mL are common and harmless. The lower ANC levels are driven by the Duffy null [Fy(a-b-)] phenotype, which is protective against malaria and seen in 80% to 100% of those of [place] African ancestry and <1% of those of European descent. Benign ethnic neutropenia is clinically insignificant, but the average ANC values differ from what are typically seen in those of European descent. (Reference: Blood. 2021; 137(1):13-15)   Cancer Staging  No matching staging information was found for the patient.   No problem-specific Assessment & Plan notes found for this encounter.   Orders Placed This Encounter  Procedures   CBC with Differential ([place] Only)    Standing Status:   Future    Standing Expiration Date:   11/09/2022    All questions were answered. The patient knows to call the clinic with any problems, questions or concerns.  This note was electronically signed.    Barbee Cough, MD  11/27/2021 3:09 PM

## 2021-11-08 ENCOUNTER — Other Ambulatory Visit: Payer: Self-pay

## 2021-11-08 ENCOUNTER — Inpatient Hospital Stay: Payer: BC Managed Care – PPO

## 2021-11-08 ENCOUNTER — Inpatient Hospital Stay: Payer: BC Managed Care – PPO | Attending: Oncology | Admitting: Oncology

## 2021-11-08 VITALS — BP 179/87 | HR 77 | Temp 98.7°F | Resp 15 | Wt 177.8 lb

## 2021-11-08 DIAGNOSIS — R202 Paresthesia of skin: Secondary | ICD-10-CM

## 2021-11-08 DIAGNOSIS — R269 Unspecified abnormalities of gait and mobility: Secondary | ICD-10-CM | POA: Diagnosis not present

## 2021-11-08 DIAGNOSIS — D709 Neutropenia, unspecified: Secondary | ICD-10-CM

## 2021-11-08 DIAGNOSIS — Z8379 Family history of other diseases of the digestive system: Secondary | ICD-10-CM | POA: Diagnosis not present

## 2021-11-08 DIAGNOSIS — M542 Cervicalgia: Secondary | ICD-10-CM

## 2021-11-08 DIAGNOSIS — G479 Sleep disorder, unspecified: Secondary | ICD-10-CM

## 2021-11-08 DIAGNOSIS — Z83718 Other family history of colon polyps: Secondary | ICD-10-CM

## 2021-11-08 DIAGNOSIS — Z79899 Other long term (current) drug therapy: Secondary | ICD-10-CM

## 2021-11-08 DIAGNOSIS — R002 Palpitations: Secondary | ICD-10-CM | POA: Diagnosis not present

## 2021-11-08 DIAGNOSIS — R059 Cough, unspecified: Secondary | ICD-10-CM | POA: Diagnosis not present

## 2021-11-08 DIAGNOSIS — R509 Fever, unspecified: Secondary | ICD-10-CM | POA: Diagnosis not present

## 2021-11-08 DIAGNOSIS — M25559 Pain in unspecified hip: Secondary | ICD-10-CM

## 2021-11-08 DIAGNOSIS — E559 Vitamin D deficiency, unspecified: Secondary | ICD-10-CM

## 2021-11-08 DIAGNOSIS — Z7189 Other specified counseling: Secondary | ICD-10-CM

## 2021-11-08 LAB — TECHNOLOGIST SMEAR REVIEW

## 2021-11-08 LAB — CMP (CANCER CENTER ONLY)
ALT: 15 U/L (ref 0–44)
AST: 23 U/L (ref 15–41)
Albumin: 4.6 g/dL (ref 3.5–5.0)
Alkaline Phosphatase: 114 U/L (ref 38–126)
Anion gap: 6 (ref 5–15)
BUN: 17 mg/dL (ref 8–23)
CO2: 30 mmol/L (ref 22–32)
Calcium: 10.5 mg/dL — ABNORMAL HIGH (ref 8.9–10.3)
Chloride: 105 mmol/L (ref 98–111)
Creatinine: 0.97 mg/dL (ref 0.44–1.00)
GFR, Estimated: >60 mL/min (ref >60)
Glucose, Bld: 87 mg/dL (ref 70–99)
Potassium: 3.4 mmol/L — ABNORMAL LOW (ref 3.5–5.1)
Sodium: 141 mmol/L (ref 135–145)
Total Bilirubin: 0.3 mg/dL (ref 0.3–1.2)
Total Protein: 8 g/dL (ref 6.5–8.1)

## 2021-11-08 LAB — CBC WITH DIFFERENTIAL (CANCER CENTER ONLY)
Abs Immature Granulocytes: 0.01 10*3/uL (ref 0.00–0.07)
Basophils Absolute: 0 10*3/uL (ref 0.0–0.1)
Basophils Relative: 1 %
Eosinophils Absolute: 0.1 10*3/uL (ref 0.0–0.5)
Eosinophils Relative: 2 %
HCT: 37.7 % (ref 36.0–46.0)
Hemoglobin: 12.1 g/dL (ref 12.0–15.0)
Immature Granulocytes: 0 %
Lymphocytes Relative: 43 %
Lymphs Abs: 1.9 10*3/uL (ref 0.7–4.0)
MCH: 30.3 pg (ref 26.0–34.0)
MCHC: 32.1 g/dL (ref 30.0–36.0)
MCV: 94.3 fL (ref 80.0–100.0)
Monocytes Absolute: 0.3 10*3/uL (ref 0.1–1.0)
Monocytes Relative: 6 %
Neutro Abs: 2.1 10*3/uL (ref 1.7–7.7)
Neutrophils Relative %: 48 %
Platelet Count: 260 10*3/uL (ref 150–400)
RBC: 4 MIL/uL (ref 3.87–5.11)
RDW: 12.6 % (ref 11.5–15.5)
WBC Count: 4.4 10*3/uL (ref 4.0–10.5)
nRBC: 0 % (ref 0.0–0.2)

## 2021-11-08 LAB — FOLATE: Folate: 12.6 ng/mL (ref >5.9)

## 2021-11-08 LAB — VITAMIN B12: Vitamin B-12: 1525 pg/mL — ABNORMAL HIGH (ref 180–914)

## 2021-11-10 LAB — ANA COMPREHENSIVE PANEL

## 2021-11-10 LAB — RHEUMATOID FACTOR: Rheumatoid fact SerPl-aCnc: <10 IU/mL (ref <14.0)

## 2021-11-11 ENCOUNTER — Encounter: Payer: Self-pay | Admitting: Cardiology

## 2021-11-11 ENCOUNTER — Telehealth: Payer: Self-pay | Admitting: Oncology

## 2021-11-11 NOTE — Telephone Encounter (Signed)
Scheduled appt per 10/20 los. Called pt, no answer. Left msg with appt date/time.

## 2021-11-12 ENCOUNTER — Ambulatory Visit
Admission: RE | Admit: 2021-11-12 | Discharge: 2021-11-12 | Disposition: A | Discharge status: Home / Self Care | Payer: BC Managed Care – PPO | Source: Ambulatory Visit | Attending: Physician Assistant | Admitting: Physician Assistant

## 2021-11-12 ENCOUNTER — Other Ambulatory Visit: Payer: Self-pay | Admitting: Physician Assistant

## 2021-11-12 DIAGNOSIS — M25571 Pain in right ankle and joints of right foot: Secondary | ICD-10-CM | POA: Diagnosis not present

## 2021-11-12 DIAGNOSIS — W19XXXA Unspecified fall, initial encounter: Secondary | ICD-10-CM

## 2021-11-13 ENCOUNTER — Ambulatory Visit: Payer: BC Managed Care – PPO | Admitting: Rheumatology

## 2021-11-14 DIAGNOSIS — M25571 Pain in right ankle and joints of right foot: Secondary | ICD-10-CM | POA: Diagnosis not present

## 2021-11-14 DIAGNOSIS — S93401A Sprain of unspecified ligament of right ankle, initial encounter: Secondary | ICD-10-CM | POA: Diagnosis not present

## 2021-11-18 MED ORDER — VALSARTAN 320 MG PO TABS: 320.0000 mg | ORAL_TABLET | Freq: Every day | ORAL | 3 refills | Status: DC

## 2021-11-19 LAB — COPPER, SERUM: Copper: 137 ug/dL (ref 80–158)

## 2021-11-20 DIAGNOSIS — W19XXXA Unspecified fall, initial encounter: Secondary | ICD-10-CM | POA: Diagnosis not present

## 2021-11-20 DIAGNOSIS — S79912A Unspecified injury of left hip, initial encounter: Secondary | ICD-10-CM | POA: Diagnosis not present

## 2021-11-20 DIAGNOSIS — S99912A Unspecified injury of left ankle, initial encounter: Secondary | ICD-10-CM | POA: Diagnosis not present

## 2021-11-20 DIAGNOSIS — S3992XA Unspecified injury of lower back, initial encounter: Secondary | ICD-10-CM | POA: Diagnosis not present

## 2021-11-25 DIAGNOSIS — R2689 Other abnormalities of gait and mobility: Secondary | ICD-10-CM | POA: Diagnosis not present

## 2021-11-25 DIAGNOSIS — R202 Paresthesia of skin: Secondary | ICD-10-CM | POA: Diagnosis not present

## 2021-11-25 DIAGNOSIS — M5442 Lumbago with sciatica, left side: Secondary | ICD-10-CM | POA: Diagnosis not present

## 2021-11-25 DIAGNOSIS — M4802 Spinal stenosis, cervical region: Secondary | ICD-10-CM | POA: Diagnosis not present

## 2021-11-27 DIAGNOSIS — Z7189 Other specified counseling: Secondary | ICD-10-CM | POA: Insufficient documentation

## 2021-11-29 ENCOUNTER — Ambulatory Visit: Payer: BC Managed Care – PPO | Admitting: Oncology

## 2021-12-19 NOTE — Progress Notes (Signed)
[facility] Initial Visit:  Patient Care Team: Lois Huxley, PA as PCP - General (Family Medicine) Barbee Cough, MD as Attending Physician (Hematology)  CHIEF COMPLAINTS/PURPOSE OF CONSULTATION:  Oncology History   No history exists.    HISTORY OF PRESENTING ILLNESS: Sara Lara 66 y.o. female is here because of leukopenia Medical history notable for hypertension, HSV-2, breast cancer diagnosed in 1991, vitamin D deficiency Has been evaluated at [facility] for neurologic problems  October 15, 2021: WBC 3.3 hemoglobin 12.3 MCV 94 platelet count 158; 48 segs 43 lymphs 7 monos 2 eos 1 basophil.  ANC 1.58 Chemistry is notable for calcium 10.9 albumin 4.6 alk phos 112. B12 485 folate 18.9 HIV negative  ESR 18  November 08 2021:  [facility] Hematology Consult Patient states that the breast cancer diagnosed in 1991 involved the right breast and was treated with lumpectomy and ultimately mastectomy and LN disection.  She received anthracycline based chemotherapy but no radiation followed by tamoxifen x 3.5 yrs.  The TAM was stopped early due because patient was concerned about potential side effects.  Last mammogram was performed in May 2023 at [facility] office.    Social:  Married.  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  Tobacco none.  EtOH none  [facility] Mother died 7 MI Father died 82 kidney failure Brother x 6.  Eldest two have medical issues related to serving in [place] One with ESRD on HD  WBC 4.4 Hgb 12.1 PLT 260 48 seg 43 lymph 6 mono 2 eos 1 baso ANC 2.1 Chemistries notable for Ca 10.5 Alb 4.6 B12 1525 Folate 12.6 Copper 137  ANA panel negative RF netative  December 20 2021:  Scheduled follow up for management of leukopenia.  Reviewed results of labs with patient.    Since last visit has fallen several times.  On one of those occasions she fell down stairs and fractured right ankle.  Wearing orthopedic boot has further exacerbated  balance issues.  Reviewed results of labs with patient   Review of Systems  Constitutional:  Positive for fever and unexpected weight change. Negative for appetite change, chills and fatigue.  HENT:   Negative for hearing loss, lump/mass, mouth sores, nosebleeds, sore throat, tinnitus, trouble swallowing and voice change.   Eyes:  Negative for eye problems and icterus.       Vision changes:  None  Respiratory:  Positive for cough. Negative for chest tightness, hemoptysis and shortness of breath.        Slight cough due to seasonal allergies and post nasal gtt  Cardiovascular:  Positive for palpitations. Negative for chest pain and leg swelling.       Has PAC's which was found after patient received second COVID booster Developed LE edema with amlodipine  Gastrointestinal:  Negative for abdominal pain, blood in stool, constipation, diarrhea, nausea and vomiting.  Endocrine: Negative for hot flashes.       Cold intolerance:  none Heat intolerance:  none  Genitourinary:  Negative for difficulty urinating, dysuria, frequency and hematuria.   Musculoskeletal:  Positive for gait problem, neck pain and neck stiffness. Negative for arthralgias, back pain and myalgias.  Skin:  Negative for itching, rash and wound.  Neurological:  Positive for extremity weakness, gait problem and numbness. Negative for dizziness, headaches and light-headedness.       Developed gait problems and chest wall pain and left axillary pain following COVID booster.    Hematological:  Negative  for adenopathy. Does not bruise/bleed easily.  Psychiatric/Behavioral:  Positive for sleep disturbance. Negative for suicidal ideas. The patient is not nervous/anxious.     MEDICAL HISTORY: Past Medical History:  Diagnosis Date   Adverse reaction to COVID-19 vaccine 08/23/2020   PAC's after 4th covid vaccine pfizer   Allergy    seasonal   Anemia    "when had breast cancer,1991   Arthritis    back,toe,ankles   Cancer ([place])     right breast   Cataract    beginning stage   Heart murmur    Hypertension    Neuromuscular disorder (HCC)    nerve pain to LLE since neck surgery   PAC (premature atrial contraction)    Sciatic nerve disease     SURGICAL HISTORY: Past Surgical History:  Procedure Laterality Date   ANTERIOR CERVICAL DISCECTOMY & FUSION SINGLE LEVEL C7-T1     january 5 th 2023   BREAST LUMPECTOMY WITH AXILLARY LYMPH NODE DISSECTION Right    BREAST RECONSTRUCTION Right    bresat surgery Left    reduction   COLONOSCOPY  07/02/2021   COLONOSCOPY      SOCIAL HISTORY: Social History   Socioeconomic History   Marital status: Divorced    Spouse name: Not on file   Number of children: Not on file   Years of education: Not on file   Highest education level: Not on file  Occupational History   Not on file  Tobacco Use   Smoking status: Never    Passive exposure: Never   Smokeless tobacco: Never  Vaping Use   Vaping Use: Never used  Substance and Sexual Activity   Alcohol use: Never   Drug use: Never   Sexual activity: Not Currently    Birth control/protection: Post-menopausal  Other Topics Concern   Not on file  Social History Narrative   Not on file   Social Determinants of Health   Financial Resource Strain: Not on file  Food Insecurity: Not on file  Transportation Needs: Not on file  Physical Activity: Not on file  Stress: Not on file  Social Connections: Not on file  Intimate Partner Violence: Not on file    FAMILY HISTORY Family History  Problem Relation Age of Onset   Colon polyps Mother    Colon polyps Father    Crohn's disease Child    Colon cancer Neg Hx    Esophageal cancer Neg Hx    Rectal cancer Neg Hx    Stomach cancer Neg Hx     ALLERGIES:  is allergic to darvon [propoxyphene], milk-related compounds, percocet [oxycodone-acetaminophen], and tramadol.  MEDICATIONS:  Current Outpatient Medications  Medication Sig Dispense Refill   acetaminophen (TYLENOL)  325 MG tablet Take 650 mg by mouth every 6 (six) hours as needed.     Calcium Carbonate (CALCIUM 600 PO) Take 600 mg by mouth 2 (two) times daily.     carvedilol (COREG) 12.5 MG tablet Take 1 tablet (12.5 mg total) by mouth 2 (two) times daily. 60 tablet 3   fluticasone (FLONASE) 50 MCG/ACT nasal spray Place into both nostrils as needed.     gabapentin (NEURONTIN) 300 MG capsule Take 2 capsules (600 mg total) by mouth 3 (three) times daily. 180 capsule 6   indapamide (LOZOL) 2.5 MG tablet Take 2 tablets (5 mg total) by mouth daily. (Patient not taking: Reported on 09/27/2021) 30 tablet 3   MAGNESIUM PO Take by mouth daily.     Multiple Vitamin (MULTIVITAMIN ADULT  PO) Take 1 tablet by mouth daily.     POTASSIUM CHLORIDE PO Take 20 mEq by mouth daily.     valACYclovir (VALTREX) 500 MG tablet Take 500 mg by mouth 2 (two) times daily as needed.     valsartan (DIOVAN) 320 MG tablet Take 1 tablet (320 mg total) by mouth daily. 90 tablet 3   No current facility-administered medications for this visit.    PHYSICAL EXAMINATION:  ECOG PERFORMANCE STATUS: 1 - Symptomatic but completely ambulatory   There were no vitals filed for this visit.   There were no vitals filed for this visit.    Physical Exam Vitals and nursing note reviewed.  Constitutional:      Appearance: Normal appearance. She is not toxic-appearing or diaphoretic.     Comments: Here alone.    HENT:     Head: Normocephalic and atraumatic.     Right Ear: External ear normal.     Left Ear: External ear normal.     Nose: Nose normal. No congestion or rhinorrhea.  Eyes:     General: No scleral icterus.    Extraocular Movements: Extraocular movements intact.     Conjunctiva/sclera: Conjunctivae normal.     Pupils: Pupils are equal, round, and reactive to light.  Cardiovascular:     Rate and Rhythm: Normal rate.     Heart sounds: No murmur heard.    No friction rub. No gallop.  Abdominal:     General: Bowel sounds are  normal.     Palpations: Abdomen is soft.  Musculoskeletal:        General: No swelling, tenderness or deformity.     Cervical back: Normal range of motion and neck supple. No rigidity or tenderness.  Lymphadenopathy:     Head:     Right side of head: No submental, submandibular, tonsillar, preauricular, posterior auricular or occipital adenopathy.     Left side of head: No submental, submandibular, tonsillar, preauricular, posterior auricular or occipital adenopathy.     Cervical: No cervical adenopathy.     Right cervical: No superficial, deep or posterior cervical adenopathy.    Left cervical: No superficial, deep or posterior cervical adenopathy.     Upper Body:     Right upper body: No supraclavicular, axillary, pectoral or epitrochlear adenopathy.     Left upper body: No supraclavicular, axillary, pectoral or epitrochlear adenopathy.  Skin:    General: Skin is warm.     Coloration: Skin is not jaundiced.  Neurological:     General: No focal deficit present.     Mental Status: She is alert and oriented to person, place, and time. Mental status is at baseline.     Cranial Nerves: No cranial nerve deficit.  Psychiatric:        Mood and Affect: Mood normal.        Behavior: Behavior normal.        Thought Content: Thought content normal.        Judgment: Judgment normal.    LABORATORY DATA: I have personally reviewed the data as listed:  No visits with results within 1 Month(s) from this visit.  Latest known visit with results is:  Orders Only on 11/08/2021  Component Date Value Ref Range Status   ds DNA Ab 11/08/2021 <1  0 - 9 IU/mL Final   Comment: (NOTE)  Negative      <5                                   Equivocal  5 - 9                                   Positive      >9    Ribonucleic Protein 11/08/2021 <0.2  0.0 - 0.9 AI Final   ENA SM Ab Ser-aCnc 11/08/2021 <0.2  0.0 - 0.9 AI Final   Scleroderma (Scl-70) (ENA) Antibod* 11/08/2021  <0.2  0.0 - 0.9 AI Final   SSA (Ro) (ENA) Antibody, IgG 11/08/2021 <0.2  0.0 - 0.9 AI Final   SSB (La) (ENA) Antibody, IgG 11/08/2021 <0.2  0.0 - 0.9 AI Final   Chromatin Ab SerPl-aCnc 11/08/2021 <0.2  0.0 - 0.9 AI Final   Anti JO-1 11/08/2021 <0.2  0.0 - 0.9 AI Final   Centromere Ab Screen 11/08/2021 <0.2  0.0 - 0.9 AI Final   See below: 11/08/2021 Comment   Final   Comment: (NOTE) Pattern              Potential Disease Association -------------  --------------------------------------------- Homogeneous    Systemic Lupus Erythematosus, Drug Induced               Systemic Lupus Erythematosus, Chronic               Autoimmune hepatitis, Juvenile Idiopathic               Arthritis -------------  --------------------------------------------- Speckled       Sjogren Syndrome, Systemic Lupus               Erythematosus, Subacute Cutaneous Lupus,               Neonatal Lupus, Congenital Heart Block,               Mixed Connective Tissue Disease,               Scleroderma-diffuse, Scleroderma-Autoimmune               Myositis Overlap Syndrome, Systemic Lupus               Erythematosus-Scleroderma-Autoimmune               Myositis Overlap Syndrome, Systemic               Autoimmune Rheumatic Disease,               Undifferentiated Connective Tissue Disease -------------  --------------------------------------------- Nucleolar      Systemic Sclero                          sis, Scleroderma-Autoimmune               Myositis Overlap Syndrome, Sjogren               Syndrome, Raynaud phenomenon, Pulmonary               Arterial Hypertension, Systemic Autoimmune               Rheumatic Disease, Cancer -------------  --------------------------------------------- Centromere     Scleroderma-CREST, Limited Cutaneous SSc,               Raynaud's Phenomenon, Primary  Biliary               Cholangitis -------------  --------------------------------------------- Nuclear Dot    Primary Biliary  Cholangitis -------------  --------------------------------------------- Nuclear        Primary Biliary Cholangitis, Autoimmune Membrane       Hepatitis/Liver disease, Systemic Autoimmune               Rheumatic Disease, Autoimmune Cytopenias,               Linear Scleroderma, Antiphospholipid Syndrome -------------  --------------------------------------------- Performed At: [facility] [place], [place] [id number] Rush Farmer MD Ph:8                          [phone]    WBC MORPHOLOGY 11/08/2021 MORPHOLOGY UNREMARKABLE   Final   RBC MORPHOLOGY 11/08/2021 MORPHOLOGY UNREMARKABLE   Final   Plt Morphology 11/08/2021 MORPHOLOGY UNREMARKABLE   Final   Clinical Information 11/08/2021 neutropenia   Final   Performed at [facility], [place] [address], [place], [place] [address]    RADIOGRAPHIC STUDIES: I have personally reviewed the radiological images as listed and agree with the findings in the report  No results found.  ASSESSMENT/PLAN  66 y.o. with medical history notable for hypertension, HSV-2, breast cancer diagnosed in 1991, vitamin D deficiency.  Patient seen for evaluation of mild neutropenia  Neutropenia::  Mild and without complication.  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   CMP, ANA panel, RF, B12 and folate are unremarkable.  No history of recurrent infections or connective tissue symptoms to suggest a sinister cause.  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     Benign ethnic neutropenia:  Describes the phenotype of having an absolute neutrophil count (ANC) <1500 cells/mL with no increased risk of infection. It is  most commonly seen in those of African ancestry.  [place] reference ranges from countries in [place] emphasize that [place] levels <1500 cells/mL are common and harmless. The lower ANC levels are driven by the Duffy null [Fy(a-b-)] phenotype, which is protective against malaria and seen in 80% to 100% of  those of [place] ancestry and <1% of those of European descent. Benign ethnic neutropenia is clinically insignificant, but the average ANC values differ from what are typically seen in those of European descent. (Reference: Blood. 2021; 137(1):13-15)  Follow up:  PRN.  Patient in agreement with this plan   Cancer Staging  No matching staging information was found for the patient.   No problem-specific Assessment & Plan notes found for this encounter.   No orders of the defined types were placed in this encounter.   All questions were answered. The patient knows to call the clinic with any problems, questions or concerns.  This note was electronically signed.    Barbee Cough, MD  12/19/2021 1:05 PM

## 2021-12-20 ENCOUNTER — Inpatient Hospital Stay: Payer: BC Managed Care – PPO | Attending: Oncology | Admitting: Oncology

## 2021-12-20 ENCOUNTER — Other Ambulatory Visit: Payer: Self-pay

## 2021-12-20 VITALS — BP 176/100 | HR 72 | Temp 97.9°F | Resp 16 | Wt 177.6 lb

## 2021-12-20 DIAGNOSIS — Z885 Allergy status to narcotic agent status: Secondary | ICD-10-CM | POA: Insufficient documentation

## 2021-12-20 DIAGNOSIS — Z888 Allergy status to other drugs, medicaments and biological substances status: Secondary | ICD-10-CM | POA: Insufficient documentation

## 2021-12-20 DIAGNOSIS — M542 Cervicalgia: Secondary | ICD-10-CM | POA: Diagnosis not present

## 2021-12-20 DIAGNOSIS — I10 Essential (primary) hypertension: Secondary | ICD-10-CM | POA: Diagnosis not present

## 2021-12-20 DIAGNOSIS — Z7189 Other specified counseling: Secondary | ICD-10-CM | POA: Diagnosis not present

## 2021-12-20 DIAGNOSIS — G479 Sleep disorder, unspecified: Secondary | ICD-10-CM | POA: Insufficient documentation

## 2021-12-20 DIAGNOSIS — R509 Fever, unspecified: Secondary | ICD-10-CM | POA: Diagnosis not present

## 2021-12-20 DIAGNOSIS — Z79899 Other long term (current) drug therapy: Secondary | ICD-10-CM | POA: Diagnosis not present

## 2021-12-20 DIAGNOSIS — S82891A Other fracture of right lower leg, initial encounter for closed fracture: Secondary | ICD-10-CM | POA: Diagnosis not present

## 2021-12-20 DIAGNOSIS — Z8379 Family history of other diseases of the digestive system: Secondary | ICD-10-CM | POA: Diagnosis not present

## 2021-12-20 DIAGNOSIS — R002 Palpitations: Secondary | ICD-10-CM | POA: Diagnosis not present

## 2021-12-20 DIAGNOSIS — R29818 Other symptoms and signs involving the nervous system: Secondary | ICD-10-CM | POA: Diagnosis not present

## 2021-12-20 DIAGNOSIS — Z853 Personal history of malignant neoplasm of breast: Secondary | ICD-10-CM | POA: Diagnosis not present

## 2021-12-20 DIAGNOSIS — Z83711 Family history of hyperplastic colon polyps: Secondary | ICD-10-CM | POA: Diagnosis not present

## 2021-12-20 DIAGNOSIS — E559 Vitamin D deficiency, unspecified: Secondary | ICD-10-CM | POA: Insufficient documentation

## 2021-12-20 DIAGNOSIS — D709 Neutropenia, unspecified: Secondary | ICD-10-CM | POA: Diagnosis not present

## 2021-12-20 DIAGNOSIS — R0789 Other chest pain: Secondary | ICD-10-CM | POA: Diagnosis not present

## 2021-12-20 DIAGNOSIS — W109XXA Fall (on) (from) unspecified stairs and steps, initial encounter: Secondary | ICD-10-CM | POA: Diagnosis not present

## 2021-12-20 DIAGNOSIS — R059 Cough, unspecified: Secondary | ICD-10-CM | POA: Diagnosis not present

## 2021-12-20 DIAGNOSIS — Z9011 Acquired absence of right breast and nipple: Secondary | ICD-10-CM | POA: Diagnosis not present

## 2021-12-24 DIAGNOSIS — R202 Paresthesia of skin: Secondary | ICD-10-CM | POA: Diagnosis not present

## 2021-12-24 DIAGNOSIS — R200 Anesthesia of skin: Secondary | ICD-10-CM | POA: Diagnosis not present

## 2021-12-25 DIAGNOSIS — R200 Anesthesia of skin: Secondary | ICD-10-CM | POA: Diagnosis not present

## 2021-12-31 DIAGNOSIS — M5442 Lumbago with sciatica, left side: Secondary | ICD-10-CM | POA: Diagnosis not present

## 2021-12-31 DIAGNOSIS — G8929 Other chronic pain: Secondary | ICD-10-CM | POA: Diagnosis not present

## 2022-01-01 DIAGNOSIS — G5602 Carpal tunnel syndrome, left upper limb: Secondary | ICD-10-CM | POA: Diagnosis not present

## 2022-01-27 DIAGNOSIS — Z4789 Encounter for other orthopedic aftercare: Secondary | ICD-10-CM | POA: Diagnosis not present

## 2022-01-27 DIAGNOSIS — M4712 Other spondylosis with myelopathy, cervical region: Secondary | ICD-10-CM | POA: Diagnosis not present

## 2022-01-27 DIAGNOSIS — M5031 Other cervical disc degeneration,  high cervical region: Secondary | ICD-10-CM | POA: Diagnosis not present

## 2022-01-27 DIAGNOSIS — M4312 Spondylolisthesis, cervical region: Secondary | ICD-10-CM | POA: Diagnosis not present

## 2022-01-27 DIAGNOSIS — Z981 Arthrodesis status: Secondary | ICD-10-CM | POA: Diagnosis not present

## 2022-01-27 DIAGNOSIS — R29898 Other symptoms and signs involving the musculoskeletal system: Secondary | ICD-10-CM | POA: Diagnosis not present

## 2022-03-10 ENCOUNTER — Emergency Department
Admission: EM | Admit: 2022-03-10 | Discharge: 2022-03-10 | Disposition: A | Discharge status: Home / Self Care | Payer: BC Managed Care – PPO | Attending: Emergency Medicine | Admitting: Emergency Medicine

## 2022-03-10 DIAGNOSIS — R55 Syncope and collapse: Secondary | ICD-10-CM | POA: Diagnosis not present

## 2022-03-10 DIAGNOSIS — Z1152 Encounter for screening for COVID-19: Secondary | ICD-10-CM | POA: Diagnosis not present

## 2022-03-10 DIAGNOSIS — I10 Essential (primary) hypertension: Secondary | ICD-10-CM | POA: Insufficient documentation

## 2022-03-10 DIAGNOSIS — Z853 Personal history of malignant neoplasm of breast: Secondary | ICD-10-CM | POA: Insufficient documentation

## 2022-03-10 LAB — URINALYSIS, ROUTINE W REFLEX MICROSCOPIC
Bilirubin Urine: NEGATIVE
Glucose, UA: NEGATIVE mg/dL
Hgb urine dipstick: NEGATIVE
Ketones, ur: NEGATIVE mg/dL
Nitrite: NEGATIVE
Protein, ur: NEGATIVE mg/dL
Specific Gravity, Urine: 1.015 (ref 1.005–1.030)
pH: 5 (ref 5.0–8.0)

## 2022-03-10 LAB — RESP PANEL BY RT-PCR (RSV, FLU A&B, COVID)  RVPGX2
Influenza A by PCR: NEGATIVE
Influenza B by PCR: NEGATIVE
Resp Syncytial Virus by PCR: NEGATIVE
SARS Coronavirus 2 by RT PCR: NEGATIVE

## 2022-03-10 LAB — CBC
HCT: 40.7 % (ref 36.0–46.0)
Hemoglobin: 13 g/dL (ref 12.0–15.0)
MCH: 29.7 pg (ref 26.0–34.0)
MCHC: 31.9 g/dL (ref 30.0–36.0)
MCV: 93.1 fL (ref 80.0–100.0)
Platelets: 276 10*3/uL (ref 150–400)
RBC: 4.37 MIL/uL (ref 3.87–5.11)
RDW: 13.2 % (ref 11.5–15.5)
WBC: 6.8 10*3/uL (ref 4.0–10.5)
nRBC: 0 % (ref 0.0–0.2)

## 2022-03-10 LAB — BASIC METABOLIC PANEL
Anion gap: 8 (ref 5–15)
BUN: 26 mg/dL — ABNORMAL HIGH (ref 8–23)
CO2: 29 mmol/L (ref 22–32)
Calcium: 10.4 mg/dL — ABNORMAL HIGH (ref 8.9–10.3)
Chloride: 101 mmol/L (ref 98–111)
Creatinine, Ser: 1.06 mg/dL — ABNORMAL HIGH (ref 0.44–1.00)
GFR, Estimated: 58 mL/min — ABNORMAL LOW (ref >60)
Glucose, Bld: 118 mg/dL — ABNORMAL HIGH (ref 70–99)
Potassium: 3.2 mmol/L — ABNORMAL LOW (ref 3.5–5.1)
Sodium: 138 mmol/L (ref 135–145)

## 2022-03-10 LAB — TROPONIN I (HIGH SENSITIVITY): Troponin I (High Sensitivity): 6 ng/L (ref <18)

## 2022-03-10 NOTE — Discharge Instructions (Signed)
Make sure to drink plenty of fluids over the next few days and eat regularly throughout the day.  Take your normal medications as prescribed.  Return to the ER for new, worsening, or persistent severe weakness or dizziness, recurrent episodes of feeling like you are going to pass out, chest pain or difficulty breathing, vomiting or diarrhea, or any other new or worsening symptoms that concern you.  Follow-up with your regular doctor.

## 2022-03-10 NOTE — ED Notes (Signed)
Pt stated mild-mod left ear pain

## 2022-03-10 NOTE — ED Triage Notes (Signed)
Pt presents to the ED due to syncope episode. Pt states she believes she has COVID. Pt states she has diarrhea and when she attempted to make it to the bathroom, she "collapsed" in the hallway. Pt denies urinary symptoms at this moment. Pt A&Ox4

## 2022-03-10 NOTE — ED Triage Notes (Signed)
C/o lightheadedness and weakness. Relayed to [facility] she passed out this AM.

## 2022-03-10 NOTE — ED Provider Notes (Signed)
[facility] Provider Note    Event Date/Time   First MD Initiated Contact with Patient 03/10/22 1722     (approximate)   History   Loss of Consciousness   HPI  MICHEALA MAKAREWICZ is a 67 y.o. female with a history of hypertension, HSV-2, remote history of breast cancer and leukopenia who presents with an episode of syncope this morning.  The patient states that she was sitting doing work.  She suddenly started to feel very lightheaded, then became nauseated, then noted ringing in her ears and became very sweaty.  She got up to use the bathroom and had a loose bowel movement.  Subsequently she felt even more lightheaded and then passed out.  She does not think she hit her head.  She felt lightheaded for several minutes after crawl to get her phone.  She did not pass out the second time.  The patient denies any associated chest pain or difficulty breathing.  She states that she was at a funeral last week and was concerned she may have been exposed to [place].  She states over the last few hours the symptoms have resolved.  She has not had any further bowel movements and no longer feels lightheaded.  She denies any cough, difficulty breathing, sore throat, congestion, or any acute pain.  I reviewed the past medical records.  The patient's most recent outpatient encounter was with oncology on 12/1 of last year for evaluation of leukopenia.  She has no recent ED visits or admissions.   Physical Exam   Triage Vital Signs: ED Triage Vitals  Enc Vitals Group     BP 03/10/22 1437 (!) 138/94     Pulse Rate 03/10/22 1437 64     Resp 03/10/22 1437 18     Temp 03/10/22 1437 98.3 F (36.8 C)     Temp Source 03/10/22 1437 Oral     SpO2 03/10/22 1437 95 %     Weight 03/10/22 1437 168 lb (76.2 kg)     Height 03/10/22 1437 5' 4"$  (1.626 m)     Head Circumference --      Peak Flow --      Pain Score 03/10/22 1448 0     Pain Loc --      Pain Edu? --      Excl. in [place]? --      Most recent vital signs: Vitals:   03/10/22 1437  BP: (!) 138/94  Pulse: 64  Resp: 18  Temp: 98.3 F (36.8 C)  SpO2: 95%     General: Awake, no distress.  CV:  Good peripheral perfusion.  Resp:  Normal effort.  Lungs CTAB. Abd:  No distention.  Other:  EOMI.  PERRLA.  Motor intact in all extremities.  Moist mucous membranes.   ED Results / Procedures / Treatments   Labs (all labs ordered are listed, but only abnormal results are displayed) Labs Reviewed  BASIC METABOLIC PANEL - Abnormal; Notable for the following components:      Result Value   Potassium 3.2 (*)    Glucose, Bld 118 (*)    BUN 26 (*)    Creatinine, Ser 1.06 (*)    Calcium 10.4 (*)    GFR, Estimated 58 (*)    All other components within normal limits  RESP PANEL BY RT-PCR (RSV, FLU A&B, COVID)  RVPGX2  CBC  URINALYSIS, ROUTINE W REFLEX MICROSCOPIC  CBG MONITORING, ED  TROPONIN I (HIGH SENSITIVITY)  EKG  ED ECG REPORT I, Arta Silence, the attending physician, personally viewed and interpreted this ECG.  Date: 03/10/2022 EKG Time: 1451 Rate: 70 Rhythm: normal sinus rhythm with PVCs QRS Axis: normal Intervals: normal ST/T Wave abnormalities: normal Narrative Interpretation: no evidence of acute ischemia    RADIOLOGY   PROCEDURES:  Critical Care performed: No  Procedures   MEDICATIONS ORDERED IN ED: Medications - No data to display   IMPRESSION / MDM / [place] / ED COURSE  I reviewed the triage vital signs and the nursing notes.  67 year old female with PMH as noted above presents with a syncopal episode this morning with a prodrome of lightheadedness, nausea, diaphoresis, and a loose bowel movement.  Her symptoms have subsequently resolved.  Physical exam is unremarkable for acute findings.  Lab workup is reassuring.  Respiratory panel is negative.  Creatinine is minimally elevated from baseline.  Potassium is borderline low.  There is no leukocytosis  or anemia.  Differential diagnosis includes, but is not limited to, vasovagal syncope, dehydration/hypovolemia, viral syndrome.  I do not suspect cardiac cause.  EKG is nonischemic.  We will obtain a troponin.  Given the duration since the episode, there is no indication for repeat if this is negative.  Patient's presentation is most consistent with acute presentation with potential threat to life or bodily function.  ----------------------------------------- 6:50 PM on 03/10/2022 -----------------------------------------  Troponin is negative.  The patient remains asymptomatic and is ambulating without difficulty.  I did consider whether she may benefit from admission for syncope workup, however given the prodrome leading up to the syncope, the full resolution of the symptoms, and the reassuring lab workup, there is no specific indication for admission at this time.  The patient is stable for discharge.  I counseled her on the results of the workup.  I gave her strict return precautions and she expresses understanding.   FINAL CLINICAL IMPRESSION(S) / ED DIAGNOSES   Final diagnoses:  Syncope, unspecified syncope type     Rx / DC Orders   ED Discharge Orders     None        Note:  This document was prepared using Dragon voice recognition software and may include unintentional dictation errors.    Arta Silence, MD 03/10/22 1850

## 2022-03-14 DIAGNOSIS — S0083XA Contusion of other part of head, initial encounter: Secondary | ICD-10-CM | POA: Diagnosis not present

## 2022-03-14 DIAGNOSIS — R8290 Unspecified abnormal findings in urine: Secondary | ICD-10-CM | POA: Diagnosis not present

## 2022-03-14 DIAGNOSIS — E876 Hypokalemia: Secondary | ICD-10-CM | POA: Diagnosis not present

## 2022-03-14 DIAGNOSIS — M25512 Pain in left shoulder: Secondary | ICD-10-CM | POA: Diagnosis not present

## 2022-03-26 DIAGNOSIS — R8290 Unspecified abnormal findings in urine: Secondary | ICD-10-CM | POA: Diagnosis not present

## 2022-03-26 DIAGNOSIS — M25512 Pain in left shoulder: Secondary | ICD-10-CM | POA: Diagnosis not present

## 2022-03-26 DIAGNOSIS — R55 Syncope and collapse: Secondary | ICD-10-CM | POA: Diagnosis not present

## 2022-04-02 ENCOUNTER — Other Ambulatory Visit: Payer: Self-pay | Admitting: Student

## 2022-04-02 DIAGNOSIS — R55 Syncope and collapse: Secondary | ICD-10-CM

## 2022-04-02 DIAGNOSIS — M25512 Pain in left shoulder: Secondary | ICD-10-CM

## 2022-04-07 DIAGNOSIS — M79605 Pain in left leg: Secondary | ICD-10-CM | POA: Diagnosis not present

## 2022-04-07 DIAGNOSIS — M545 Low back pain, unspecified: Secondary | ICD-10-CM | POA: Diagnosis not present

## 2022-04-07 DIAGNOSIS — M4802 Spinal stenosis, cervical region: Secondary | ICD-10-CM | POA: Diagnosis not present

## 2022-04-07 DIAGNOSIS — M4712 Other spondylosis with myelopathy, cervical region: Secondary | ICD-10-CM | POA: Diagnosis not present

## 2022-04-09 ENCOUNTER — Encounter: Payer: Self-pay | Admitting: Student

## 2022-04-15 DIAGNOSIS — M4712 Other spondylosis with myelopathy, cervical region: Secondary | ICD-10-CM | POA: Diagnosis not present

## 2022-04-15 DIAGNOSIS — M79605 Pain in left leg: Secondary | ICD-10-CM | POA: Diagnosis not present

## 2022-04-15 DIAGNOSIS — M545 Low back pain, unspecified: Secondary | ICD-10-CM | POA: Diagnosis not present

## 2022-04-15 DIAGNOSIS — M4802 Spinal stenosis, cervical region: Secondary | ICD-10-CM | POA: Diagnosis not present

## 2022-04-17 DIAGNOSIS — M4712 Other spondylosis with myelopathy, cervical region: Secondary | ICD-10-CM | POA: Diagnosis not present

## 2022-04-17 DIAGNOSIS — Z7409 Other reduced mobility: Secondary | ICD-10-CM | POA: Diagnosis not present

## 2022-04-17 DIAGNOSIS — M545 Low back pain, unspecified: Secondary | ICD-10-CM | POA: Diagnosis not present

## 2022-04-17 DIAGNOSIS — M4802 Spinal stenosis, cervical region: Secondary | ICD-10-CM | POA: Diagnosis not present

## 2022-04-18 ENCOUNTER — Ambulatory Visit
Admission: RE | Admit: 2022-04-18 | Discharge: 2022-04-18 | Disposition: A | Discharge status: Home / Self Care | Payer: BC Managed Care – PPO | Source: Ambulatory Visit | Attending: Student | Admitting: Student

## 2022-04-18 DIAGNOSIS — R55 Syncope and collapse: Secondary | ICD-10-CM

## 2022-04-18 DIAGNOSIS — M25512 Pain in left shoulder: Secondary | ICD-10-CM | POA: Diagnosis not present

## 2022-04-18 MED ORDER — GADOPICLENOL 0.5 MMOL/ML IV SOLN
7.5000 mL | Freq: Once | INTRAVENOUS | Status: AC | PRN
  Administered 2022-04-18: 7.5 mL via INTRAVENOUS

## 2022-04-22 DIAGNOSIS — M4802 Spinal stenosis, cervical region: Secondary | ICD-10-CM | POA: Diagnosis not present

## 2022-04-22 DIAGNOSIS — M545 Low back pain, unspecified: Secondary | ICD-10-CM | POA: Diagnosis not present

## 2022-04-22 DIAGNOSIS — M4712 Other spondylosis with myelopathy, cervical region: Secondary | ICD-10-CM | POA: Diagnosis not present

## 2022-04-22 DIAGNOSIS — Z7409 Other reduced mobility: Secondary | ICD-10-CM | POA: Diagnosis not present

## 2022-04-24 DIAGNOSIS — M545 Low back pain, unspecified: Secondary | ICD-10-CM | POA: Diagnosis not present

## 2022-04-24 DIAGNOSIS — Z7409 Other reduced mobility: Secondary | ICD-10-CM | POA: Diagnosis not present

## 2022-04-24 DIAGNOSIS — M4802 Spinal stenosis, cervical region: Secondary | ICD-10-CM | POA: Diagnosis not present

## 2022-04-24 DIAGNOSIS — M4712 Other spondylosis with myelopathy, cervical region: Secondary | ICD-10-CM | POA: Diagnosis not present

## 2022-04-29 DIAGNOSIS — Z7409 Other reduced mobility: Secondary | ICD-10-CM | POA: Diagnosis not present

## 2022-04-29 DIAGNOSIS — M4802 Spinal stenosis, cervical region: Secondary | ICD-10-CM | POA: Diagnosis not present

## 2022-04-29 DIAGNOSIS — M545 Low back pain, unspecified: Secondary | ICD-10-CM | POA: Diagnosis not present

## 2022-04-29 DIAGNOSIS — M4712 Other spondylosis with myelopathy, cervical region: Secondary | ICD-10-CM | POA: Diagnosis not present

## 2022-05-01 DIAGNOSIS — M4802 Spinal stenosis, cervical region: Secondary | ICD-10-CM | POA: Diagnosis not present

## 2022-05-01 DIAGNOSIS — M542 Cervicalgia: Secondary | ICD-10-CM | POA: Diagnosis not present

## 2022-05-01 DIAGNOSIS — Z7409 Other reduced mobility: Secondary | ICD-10-CM | POA: Diagnosis not present

## 2022-05-01 DIAGNOSIS — M4712 Other spondylosis with myelopathy, cervical region: Secondary | ICD-10-CM | POA: Diagnosis not present

## 2022-05-02 ENCOUNTER — Telehealth: Payer: Self-pay | Admitting: Cardiology

## 2022-05-02 NOTE — Telephone Encounter (Signed)
*  STAT* If patient is at the pharmacy, call can be transferred to refill team.   1. Which medications need to be refilled? (please list name of each medication and dose if known) carvedilol (COREG) 12.5 MG tablet   2. Which pharmacy/location (including street and city if local pharmacy) is medication to be sent to?  [facility] [address], [place] - [address]    3. Do they need a 30 day or 90 day supply? 90

## 2022-05-06 MED ORDER — CARVEDILOL 12.5 MG PO TABS: 12.5000 mg | ORAL_TABLET | Freq: Two times a day (BID) | ORAL | 0 refills | Status: DC

## 2022-05-06 NOTE — Telephone Encounter (Signed)
Scheduled 04/25

## 2022-05-06 NOTE — Telephone Encounter (Signed)
Requested Prescriptions   Signed Prescriptions Disp Refills   carvedilol (COREG) 12.5 MG tablet 60 tablet 0    Sig: Take 1 tablet (12.5 mg total) by mouth 2 (two) times daily.    Authorizing Provider: Debbe Odea    Ordering User: Thayer Headings, Cleven Jansma L

## 2022-05-08 IMAGING — CR DG HIP (WITH OR WITHOUT PELVIS) 3-4V BILAT
5 series · 5 of 5 positions shown · non-contrast
Comparison: None.

CLINICAL DATA: Hip pain.

EXAM:
DG HIP (WITH OR WITHOUT PELVIS) 3-4V BILAT

[w pelvis upright]
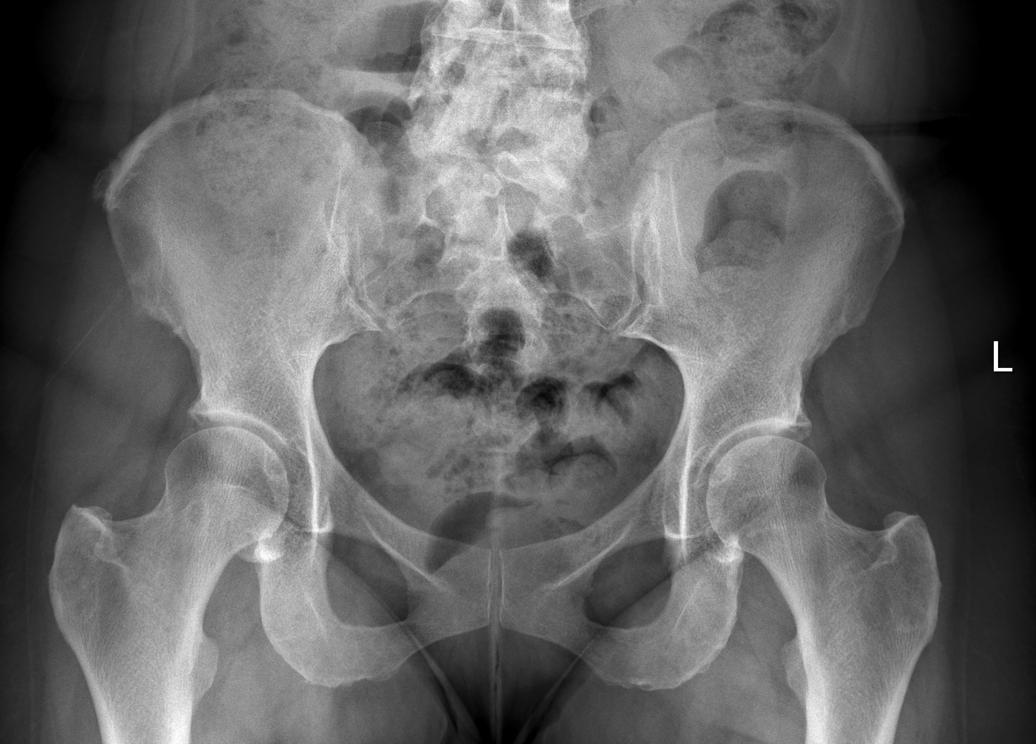

[w hip ap left]
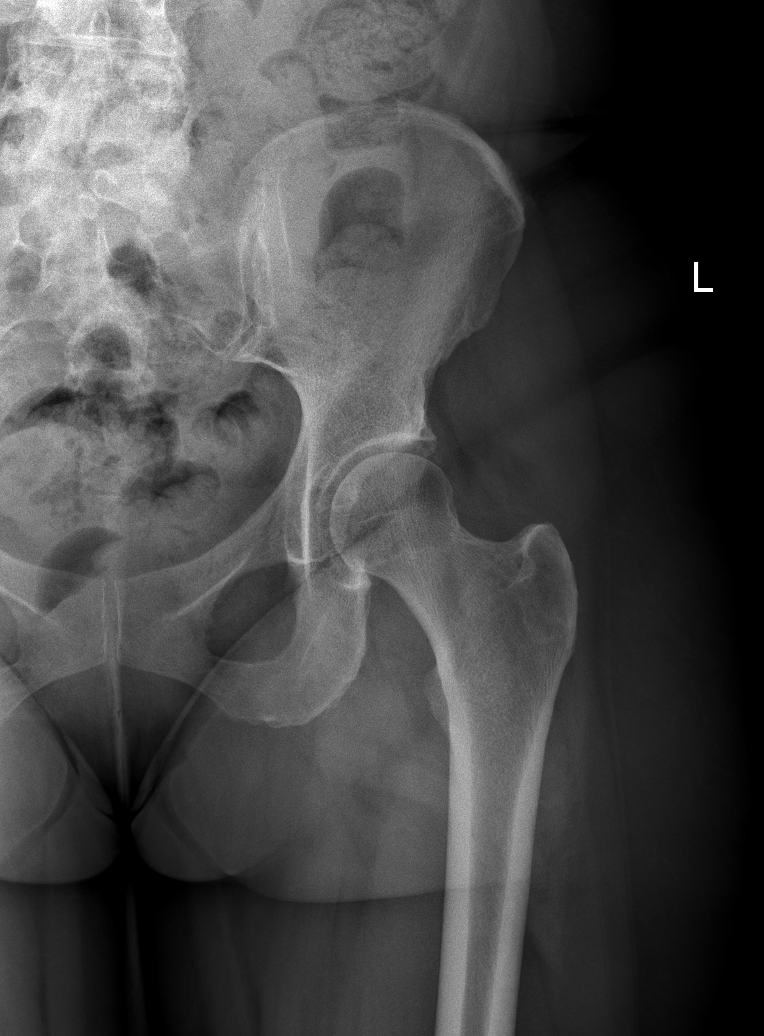

[w hip frog left]
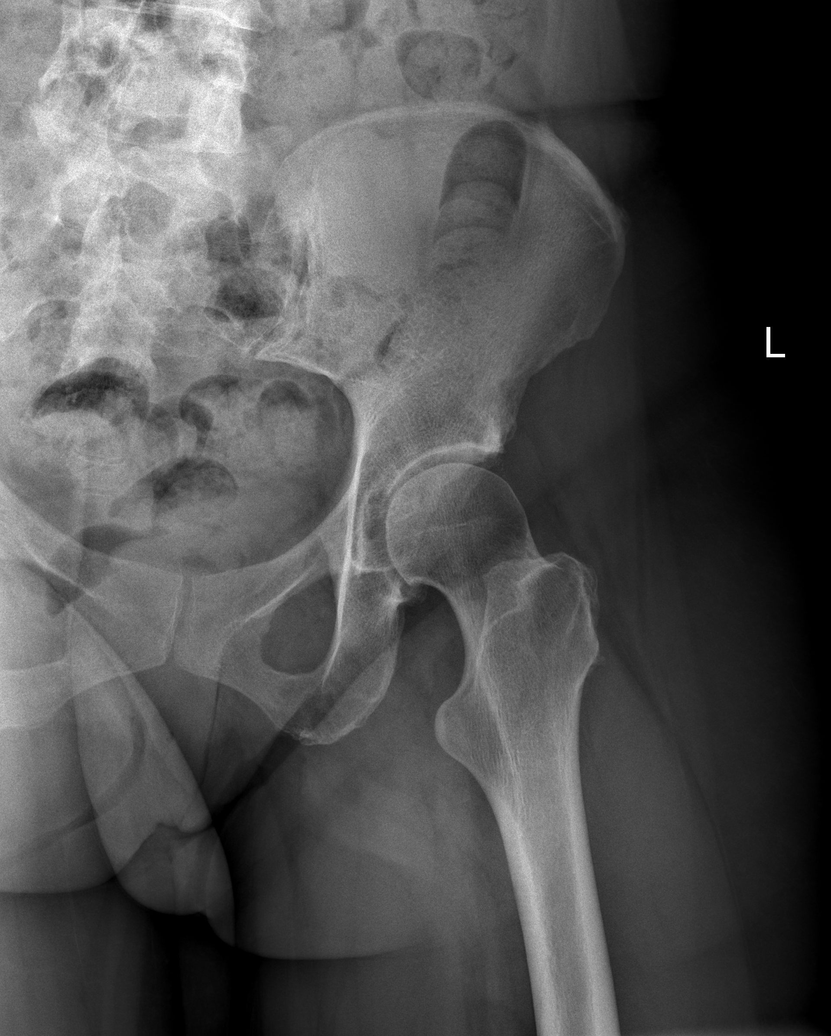

[w hip ap right]
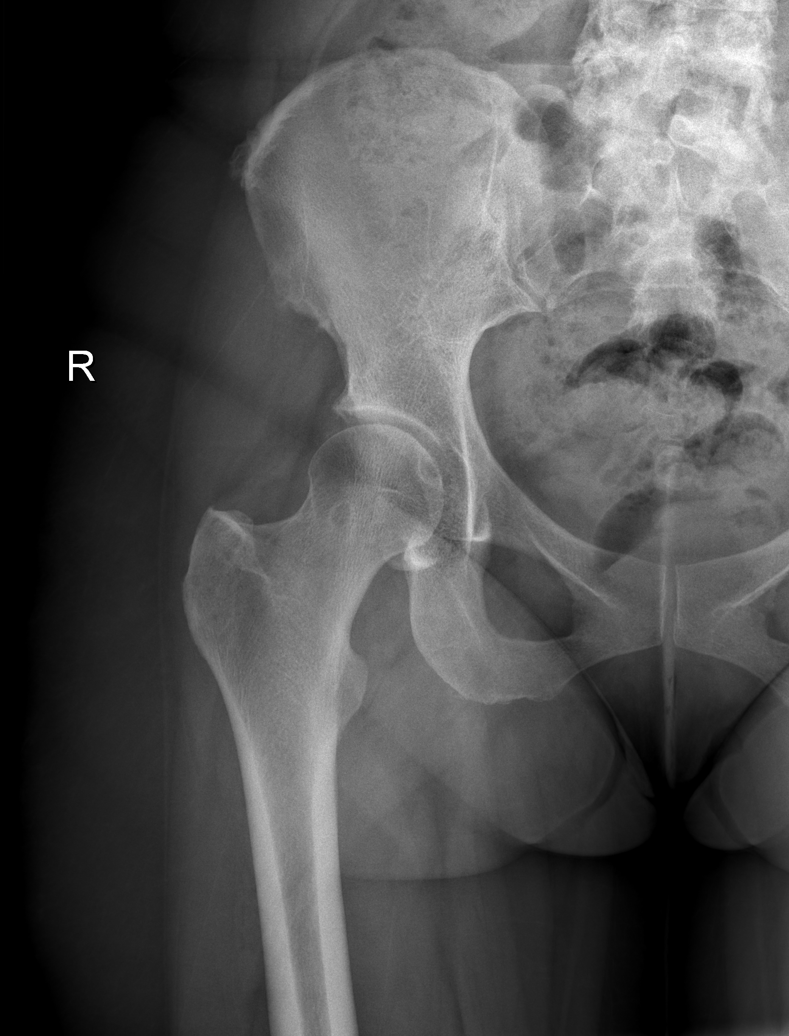

[w hip frog right]
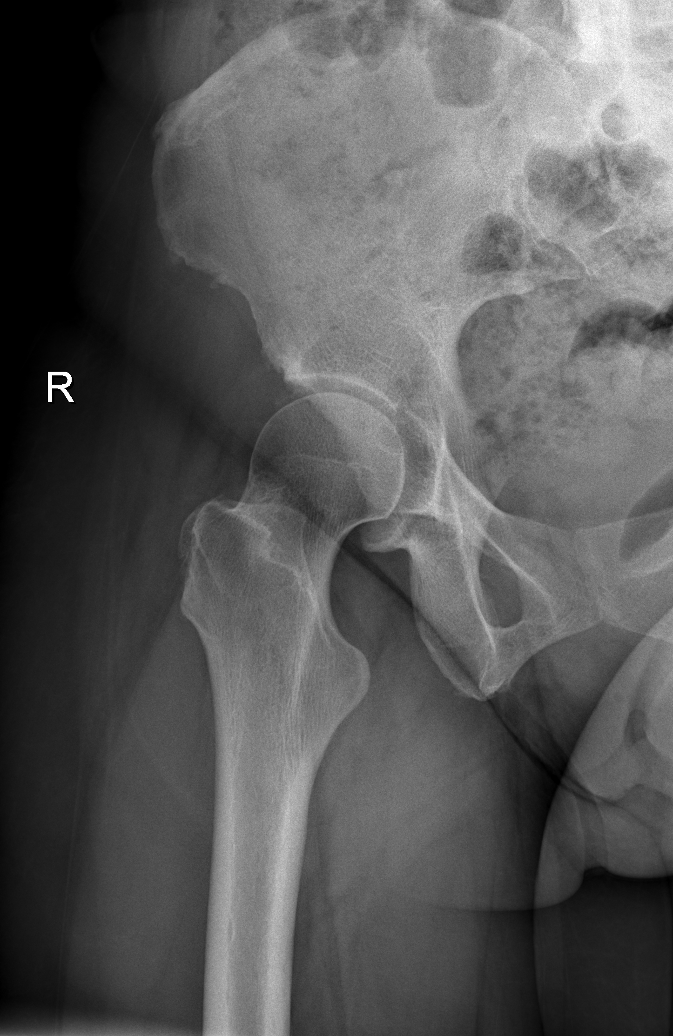

[5 of 5 positions shown; findings below may reference images not displayed]

FINDINGS: There is no evidence of hip fracture or dislocation. There is no
evidence of arthropathy or other focal bone abnormality.
IMPRESSION: Negative.

## 2022-05-13 DIAGNOSIS — M4802 Spinal stenosis, cervical region: Secondary | ICD-10-CM | POA: Diagnosis not present

## 2022-05-13 DIAGNOSIS — Z7409 Other reduced mobility: Secondary | ICD-10-CM | POA: Diagnosis not present

## 2022-05-13 DIAGNOSIS — M4712 Other spondylosis with myelopathy, cervical region: Secondary | ICD-10-CM | POA: Diagnosis not present

## 2022-05-15 ENCOUNTER — Encounter: Payer: Self-pay | Admitting: Cardiology

## 2022-05-15 ENCOUNTER — Ambulatory Visit: Payer: BC Managed Care – PPO | Attending: Cardiology | Admitting: Cardiology

## 2022-05-15 VITALS — BP 136/92 | HR 73 | Ht 64.0 in | Wt 173.4 lb

## 2022-05-15 DIAGNOSIS — I10 Essential (primary) hypertension: Secondary | ICD-10-CM

## 2022-05-15 DIAGNOSIS — R55 Syncope and collapse: Secondary | ICD-10-CM

## 2022-05-15 MED ORDER — CARVEDILOL 12.5 MG PO TABS: 12.5000 mg | ORAL_TABLET | Freq: Two times a day (BID) | ORAL | 0 refills | Status: DC

## 2022-05-15 NOTE — Patient Instructions (Signed)
Medication Instructions:   Your physician recommends that you continue on your current medications as directed. Please refer to the Current Medication list given to you today.  *If you need a refill on your cardiac medications before your next appointment, please call your pharmacy*   Lab Work:  None Ordered  If you have labs (blood work) drawn today and your tests are completely normal, you will receive your results only by: MyChart Message (if you have MyChart) OR A paper copy in the mail If you have any lab test that is abnormal or we need to change your treatment, we will call you to review the results.   Testing/Procedures:  None ordered   Follow-Up: At [facility], you and your health needs are our priority.  As part of our continuing mission to provide you with exceptional heart care, we have created designated Provider Care Teams.  These Care Teams include your primary Cardiologist (physician) and Advanced Practice Providers (APPs -  Physician Assistants and Nurse Practitioners) who all work together to provide you with the care you need, when you need it.  We recommend signing up for the patient portal called "MyChart".  Sign up information is provided on this After Visit Summary.  MyChart is used to connect with patients for Virtual Visits (Telemedicine).  Patients are able to view lab/test results, encounter notes, upcoming appointments, etc.  Non-urgent messages can be sent to your provider as well.   To learn more about what you can do with MyChart, go to [email or web address].    Your next appointment:   3 month(s)  Provider:   You may see Debbe Odea, MD or one of the following Advanced Practice Providers on your designated Care Team:   Nicolasa Ducking, NP Eula Listen, PA-C Cadence Fransico Michael, PA-C Charlsie Quest, NP

## 2022-05-15 NOTE — Progress Notes (Signed)
Cardiology Office Note:    Date:  05/15/2022   ID:  Sara, Lara [birth year], MRN [id number]  PCP:  Wilfrid Lund, PA   [facility] Providers Cardiologist:  Debbe Odea, MD     Referring MD: Wilfrid Lund, [place]   Chief Complaint  Patient presents with   Follow-up    Patient denies new or acute cardiac problems/concerns today.  Presented to ED on 03/10/22 with unspecified syncope episode.  She thinks this episode is food related.      History of Present Illness:    Sara Lara is a 67 y.o. female with a hx of hypertension, chronic neck pain who presents for follow-up.   Compliant with medications as prescribed, her blood pressure has improved although still elevated.  Currently takes valsartan and carvedilol.  Has chronic pain, which may be contributing to elevated BP.  She was recently in the emergency room after a syncopal event.  She states eating day before, complains of abdominal while sitting at her work desk at home.  She did suddenly felt weak and dizzy.  Subsequently passed out, did not lose consciousness.  She had a bowel movement and felt much better.  Was able to call her son, recommended she go to the ED.  Workup in the ED was unrevealing.  Has not had any symptoms since.  She states her brother who ate the same meal as her also presented to the ED.  She is working on eating healthier.  She stopped taking amlodipine due to leg edema.  Prior notes Echocardiogram 11/2020 EF 60 to 60%, impaired mild MR   Past Medical History:  Diagnosis Date   Adverse reaction to COVID-19 vaccine 08/23/2020   PAC's after 4th covid vaccine pfizer   Allergy    seasonal   Anemia    "when had breast cancer,1991   Arthritis    back,toe,ankles   Cancer    right breast   Cataract    beginning stage   Heart murmur    Hypertension    Neuromuscular disorder    nerve pain to LLE since neck surgery   PAC (premature atrial contraction)    Sciatic nerve disease     Past  Surgical History:  Procedure Laterality Date   ANTERIOR CERVICAL DISCECTOMY & FUSION SINGLE LEVEL C7-T1     january 5 th 2023   BREAST LUMPECTOMY WITH AXILLARY LYMPH NODE DISSECTION Right    BREAST RECONSTRUCTION Right    bresat surgery Left    reduction   COLONOSCOPY  07/02/2021   COLONOSCOPY      Current Medications: Current Meds  Medication Sig   acetaminophen (TYLENOL) 325 MG tablet Take 650 mg by mouth every 6 (six) hours as needed.   Calcium Carbonate (CALCIUM 600 PO) Take 600 mg by mouth 2 (two) times daily.   Cholecalciferol 125 MCG (5000 UT) capsule Take 5,000 Units by mouth daily.   cyanocobalamin (VITAMIN B12) 1000 MCG tablet Take 1,000 mcg by mouth daily.   fluticasone (FLONASE) 50 MCG/ACT nasal spray Place into both nostrils as needed.   gabapentin (NEURONTIN) 300 MG capsule Take 2 capsules (600 mg total) by mouth 3 (three) times daily. (Patient taking differently: Take 600 mg by mouth 5 (five) times daily.)   MAGNESIUM PO Take by mouth daily.   Multiple Vitamin (MULTIVITAMIN ADULT PO) Take 1 tablet by mouth daily.   POTASSIUM CHLORIDE PO Take 20 mEq by mouth daily.   valACYclovir (VALTREX) 500 MG tablet Take  500 mg by mouth 2 (two) times daily as needed.   valsartan (DIOVAN) 320 MG tablet Take 1 tablet (320 mg total) by mouth daily.   [DISCONTINUED] carvedilol (COREG) 12.5 MG tablet Take 1 tablet (12.5 mg total) by mouth 2 (two) times daily.     Allergies:   Darvon [propoxyphene], Milk-related compounds, Percocet [oxycodone-acetaminophen], and Tramadol   Social History   Socioeconomic History   Marital status: Divorced    Spouse name: Not on file   Number of children: Not on file   Years of education: Not on file   Highest education level: Not on file  Occupational History   Not on file  Tobacco Use   Smoking status: Never    Passive exposure: Never   Smokeless tobacco: Never  Vaping Use   Vaping Use: Never used  Substance and Sexual Activity   Alcohol  use: Never   Drug use: Never   Sexual activity: Not Currently    Birth control/protection: Post-menopausal  Other Topics Concern   Not on file  Social History Narrative   Not on file   Social Determinants of Health   Financial Resource Strain: Not on file  Food Insecurity: Not on file  Transportation Needs: Not on file  Physical Activity: Not on file  Stress: Not on file  Social Connections: Not on file     Family History: The patient's family history includes Colon polyps in her father and mother; Crohn's disease in her child. There is no history of Colon cancer, Esophageal cancer, Rectal cancer, or Stomach cancer.  ROS:   Please see the history of present illness.     All other systems reviewed and are negative.  EKGs/Labs/Other Studies Reviewed:    The following studies were reviewed today:  EKG:  EKG not ordered today.  Recent Labs: 11/08/2021: ALT 15 03/10/2022: BUN 26; Creatinine, Ser 1.06; Hemoglobin 13.0; Platelets 276; Potassium 3.2; Sodium 138  Recent Lipid Panel No results found for: "CHOL", "TRIG", "HDL", "CHOLHDL", "VLDL", "LDLCALC", "LDLDIRECT"   Risk Assessment/Calculations:          Physical Exam:    VS:  BP (!) 136/92 (BP Location: Left Arm, Patient Position: Sitting, Cuff Size: Large)   Pulse 73   Ht  (1.626 m)   Wt 173 lb 6.4 oz (78.7 kg)   SpO2 98%   BMI 29.76 kg/m     Wt Readings from Last 3 Encounters:  05/15/22 173 lb 6.4 oz (78.7 kg)  03/10/22 168 lb (76.2 kg)  12/20/21 177 lb 9.6 oz (80.6 kg)     GEN:  Well nourished, well developed in no acute distress HEENT: Normal NECK: No JVD; No carotid bruits CARDIAC: RRR, 2/6 systolic murmur RESPIRATORY:  Clear to auscultation without rales, wheezing or rhonchi  ABDOMEN: Soft, non-tender, non-distended MUSCULOSKELETAL:  No edema; No deformity  SKIN: Warm and dry NEUROLOGIC:  Alert and oriented x 3 PSYCHIATRIC:  Normal affect   ASSESSMENT:    1. Primary hypertension   2.  Vasovagal syncope     PLAN:    In order of problems listed above:  Hypertension, BP still elevated although much improved.  Systolics in the 130s.  Continue Coreg 12.5 mg twice daily, losartan 320 mg daily.  Low-salt diet advised.  If BP elevated at follow-up visit, plan to start Aldactone.  Potassium appears low on labs blood work.?  Hyper Aldo Syncope, appears vasovagal in etiology.  No further symptoms since.  Follow-up in 2-3 months.  Medication Adjustments/Labs and Tests Ordered: Current medicines are reviewed at length with the patient today.  Concerns regarding medicines are outlined above.  Orders Placed This Encounter  Procedures   EKG 12-Lead     Meds ordered this encounter  Medications   carvedilol (COREG) 12.5 MG tablet    Sig: Take 1 tablet (12.5 mg total) by mouth 2 (two) times daily.    Dispense:  180 tablet    Refill:  0      Patient Instructions  Medication Instructions:   Your physician recommends that you continue on your current medications as directed. Please refer to the Current Medication list given to you today.  *If you need a refill on your cardiac medications before your next appointment, please call your pharmacy*   Lab Work:  None Ordered  If you have labs (blood work) drawn today and your tests are completely normal, you will receive your results only by: MyChart Message (if you have MyChart) OR A paper copy in the mail If you have any lab test that is abnormal or we need to change your treatment, we will call you to review the results.   Testing/Procedures:  None ordered   Follow-Up: At [facility], you and your health needs are our priority.  As part of our continuing mission to provide you with exceptional heart care, we have created designated Provider Care Teams.  These Care Teams include your primary Cardiologist (physician) and Advanced Practice Providers (APPs -  Physician Assistants and Nurse Practitioners)  who all work together to provide you with the care you need, when you need it.  We recommend signing up for the patient portal called "MyChart".  Sign up information is provided on this After Visit Summary.  MyChart is used to connect with patients for Virtual Visits (Telemedicine).  Patients are able to view lab/test results, encounter notes, upcoming appointments, etc.  Non-urgent messages can be sent to your provider as well.   To learn more about what you can do with MyChart, go to [email or web address].    Your next appointment:   3 month(s)  Provider:   You may see Debbe Odea, MD or one of the following Advanced Practice Providers on your designated Care Team:   Nicolasa Ducking, NP Eula Listen, PA-C Cadence Fransico Michael, PA-C Charlsie Quest, NP    Signed, Debbe Odea, MD  05/15/2022 12:08 PM    [facility]

## 2022-06-30 DIAGNOSIS — M25512 Pain in left shoulder: Secondary | ICD-10-CM | POA: Diagnosis not present

## 2022-06-30 DIAGNOSIS — M24812 Other specific joint derangements of left shoulder, not elsewhere classified: Secondary | ICD-10-CM | POA: Diagnosis not present

## 2022-07-30 DIAGNOSIS — M24812 Other specific joint derangements of left shoulder, not elsewhere classified: Secondary | ICD-10-CM | POA: Diagnosis not present

## 2022-08-01 DIAGNOSIS — H6690 Otitis media, unspecified, unspecified ear: Secondary | ICD-10-CM | POA: Diagnosis not present

## 2022-08-01 DIAGNOSIS — J069 Acute upper respiratory infection, unspecified: Secondary | ICD-10-CM | POA: Diagnosis not present

## 2022-08-01 DIAGNOSIS — Z03818 Encounter for observation for suspected exposure to other biological agents ruled out: Secondary | ICD-10-CM | POA: Diagnosis not present

## 2022-08-20 ENCOUNTER — Ambulatory Visit: Payer: BC Managed Care – PPO | Admitting: Cardiology

## 2022-09-01 ENCOUNTER — Other Ambulatory Visit: Payer: Self-pay

## 2022-09-01 MED ORDER — CARVEDILOL 12.5 MG PO TABS: 12.5000 mg | ORAL_TABLET | Freq: Two times a day (BID) | ORAL | 0 refills | Status: DC

## 2022-09-01 NOTE — Telephone Encounter (Signed)
Requested Prescriptions   Signed Prescriptions Disp Refills   carvedilol (COREG) 12.5 MG tablet 180 tablet 0    Sig: Take 1 tablet (12.5 mg total) by mouth 2 (two) times daily. PLEASE CALL OFFICE TO SCHEDULE APPOINTMENT PRIOR TO NEXT REFILL    Authorizing Provider: Debbe Odea    Ordering User: Guerry Minors

## 2022-09-01 NOTE — Telephone Encounter (Signed)
last seen 05/14/22 with plan to f/u 2-3 months.  Please call pt to schedule f/u.  Thanks

## 2022-09-03 NOTE — Telephone Encounter (Signed)
LMOV  

## 2022-10-07 DIAGNOSIS — N760 Acute vaginitis: Secondary | ICD-10-CM | POA: Diagnosis not present

## 2022-10-21 DIAGNOSIS — H524 Presbyopia: Secondary | ICD-10-CM | POA: Diagnosis not present

## 2022-10-21 DIAGNOSIS — H3540 Unspecified peripheral retinal degeneration: Secondary | ICD-10-CM | POA: Diagnosis not present

## 2022-10-29 DIAGNOSIS — R350 Frequency of micturition: Secondary | ICD-10-CM | POA: Diagnosis not present

## 2022-10-29 DIAGNOSIS — Z1231 Encounter for screening mammogram for malignant neoplasm of breast: Secondary | ICD-10-CM | POA: Diagnosis not present

## 2022-10-29 DIAGNOSIS — Z01419 Encounter for gynecological examination (general) (routine) without abnormal findings: Secondary | ICD-10-CM | POA: Diagnosis not present

## 2022-12-01 ENCOUNTER — Other Ambulatory Visit: Payer: Self-pay | Admitting: *Deleted

## 2022-12-01 NOTE — Telephone Encounter (Signed)
Left voicemail to schedule follow up appt

## 2022-12-02 ENCOUNTER — Other Ambulatory Visit: Payer: Self-pay | Admitting: *Deleted

## 2022-12-08 ENCOUNTER — Other Ambulatory Visit: Payer: Self-pay | Admitting: *Deleted

## 2022-12-08 NOTE — Telephone Encounter (Signed)
LMOV to schedule  

## 2022-12-11 MED ORDER — CARVEDILOL 12.5 MG PO TABS: 12.5000 mg | ORAL_TABLET | Freq: Two times a day (BID) | ORAL | 0 refills | Status: DC

## 2022-12-11 MED ORDER — VALSARTAN 320 MG PO TABS: 320.0000 mg | ORAL_TABLET | Freq: Every day | ORAL | 0 refills | Status: DC

## 2022-12-12 ENCOUNTER — Ambulatory Visit: Payer: BC Managed Care – PPO | Admitting: Student

## 2022-12-13 NOTE — Progress Notes (Unsigned)
Cardiology Clinic Note   Date: 12/15/2022 ID: Sara Lara, Sara Lara 02/05/1955, MRN [id number]  Primary Cardiologist:  Debbe Odea, MD  Patient Profile    Sara ESCHRICH is a 67 y.o. female who presents to the clinic today for routine follow up.     Past medical history significant for: Hypertension. PACs. Cardiac murmur. Echo 11/20/2020: EF 60 to 65%.  No RWMA.  Grade I DD.  Normal global strain.  Normal RV size/function.  Normal PA pressure.  Mild MR. Chronic neck pain. Breast cancer. Syncope.  In summary, patient was first evaluated by Dr. Azucena Cecil on 10/15/2020 for PACs at the request of Horton Marshall, PA-C.  Patient developed left arm pain and myalgias after second COVID-vaccine July 2022.  She presented to the ED and EKG showed PACs.  It was recommended she follow-up with cardiology.  Systolic murmur was auscultated during exam.  Patient underwent echo which showed normal LV/RV function as detailed above.     History of Present Illness    Sara Lara is followed by Dr. Azucena Cecil for the above outlined history.   Patient was last seen in the office by Dr. Azucena Cecil on 05/15/2022 after ED visit for syncope.  Patient reported no further episodes of syncope at that time.  Patient reported on the day of syncopal episode she developed abdominal pain while sitting in her home work desk and suddenly feeling weak and dizzy she reports she did not lose consciousness.  Symptoms improved after bowel movement.  She reports her brother who ate the same meal also presented to the ED.  Her ED workup was unremarkable.  At the time of her visit she noted stopping amlodipine secondary to lower extremity edema.  BP was elevated in the office but improved.  Medications were continued and she was instructed to follow a low sodium diet.  Discussed the use of AI scribe software for clinical note transcription with the patient, who gave verbal consent to proceed.  The patient presents for routine  follow-up. She has not had any further episodes of syncope since April. She did injure her left shoulder when she had her syncopal event for which she received a cortisone injection with good result. It is starting to bother her again and she is planning to see orthopedics for follow-up. She reports an episode of chest discomfort radiating to arm x 2 last night while laying in bed. She states she had a funny sensation in her chest like her heart was not beating normally then had the discomfort. It lasted 1-2 minutes and resolved on its own. She has not had further discomfort today. She does not do formal exercise secondary to nerve pain. She ambulates with a cane. She reports she does not always feel palpitations. She reports poor hydration over the last couple of days but usually tries to stay well hydrated.          ROS: All other systems reviewed and are otherwise negative except as noted in History of Present Illness.  Studies Reviewed    EKG Interpretation Date/Time:  Monday December 15 2022 15:10:59 EST Ventricular Rate:  71 PR Interval:  132 QRS Duration:  96 QT Interval:  414 QTC Calculation: 449 R Axis:   -7  Text Interpretation: Sinus rhythm with sinus arrhythmia with frequent Premature ventricular complexes Septal infarct (cited on or before 05-Oct-2019) When compared with ECG of 10-Mar-2022 14:51, Premature ventricular complexes are now Present Premature supraventricular complexes are no longer Present Confirmed by Vamsi Apfel,  Gavin Pound [phone]) on 12/15/2022 3:25:27 PM    Physical Exam    VS:  BP 130/78 (BP Location: Left Arm, Patient Position: Sitting, Cuff Size: Normal)   Pulse 71   Ht 5\' 4"  (1.626 m)   Wt 180 lb 3.2 oz (81.7 kg)   SpO2 99%   BMI 30.93 kg/m  , BMI Body mass index is 30.93 kg/m.  GEN: Well nourished, well developed, in no acute distress. Neck: No JVD or carotid bruits. Cardiac:  RRR. Occasional extrasystole. No murmurs. No rubs or gallops.    Respiratory:  Respirations regular and unlabored. Clear to auscultation without rales, wheezing or rhonchi. GI: Soft, nontender, nondistended. Extremities: Radials/DP/PT 2+ and equal bilaterally. No clubbing or cyanosis. No edema.  Skin: Warm and dry, no rash. Neuro: Strength intact.  Assessment & Plan      Chest discomfort Patient reports brief episode of chest pain x 2 occurring last night while laying in bed. Pain described as sharp and radiating to left arm lasting 1-2 minutes and resolving on its own. No pain today. No associated shortness of breath. She did have a "funny" feeling in her chest that believes were the palpitations she occasionally feels. She admits to not hydrating well the last couple of days. She thinks the sensation is linked to her palpitations and would like to hold off on any testing at this time. Given atypical nature of pain will defer ischemic workup.  -Continue to monitor symptoms.  -ED precautions provided.   Palpitations Patient with a history of PACs since Covid booster in 2022. She reports occasionally feeling palpitations but is typically unaware. EKG today shows frequent PVCs. She admits to poor hydration the last couple of days.  -Increase hydration.  -Continue carvedilol.  -Contact office if palpitations increase.  -CBC, BMP, TSH and Mg today.   Hypertension BP today 130/78.  -Continue carvedilol, valsartan.         Disposition: CBC, BMP, TSH, Mg today. Return in 6 months or sooner as needed.          Signed, Etta Grandchild. Kaesyn Johnston, DNP, NP-C

## 2022-12-15 ENCOUNTER — Ambulatory Visit: Payer: BC Managed Care – PPO | Attending: Student | Admitting: Student

## 2022-12-15 ENCOUNTER — Encounter: Payer: Self-pay | Admitting: Student

## 2022-12-15 VITALS — BP 130/78 | HR 71 | Ht 64.0 in | Wt 180.2 lb

## 2022-12-15 DIAGNOSIS — R0789 Other chest pain: Secondary | ICD-10-CM

## 2022-12-15 DIAGNOSIS — R002 Palpitations: Secondary | ICD-10-CM | POA: Diagnosis not present

## 2022-12-15 DIAGNOSIS — I10 Essential (primary) hypertension: Secondary | ICD-10-CM | POA: Diagnosis not present

## 2022-12-15 MED ORDER — CARVEDILOL 12.5 MG PO TABS: 12.5000 mg | ORAL_TABLET | Freq: Two times a day (BID) | ORAL | 3 refills | Status: DC

## 2022-12-15 MED ORDER — VALSARTAN 320 MG PO TABS: 320.0000 mg | ORAL_TABLET | Freq: Every day | ORAL | 3 refills | Status: DC

## 2022-12-15 NOTE — Patient Instructions (Signed)
Medication Instructions:  No changes *If you need a refill on your cardiac medications before your next appointment, please call your pharmacy*   Lab Work: Your provider would like for you to have the following labs today: CBC, BMET, TSH and Magnesium  If you have labs (blood work) drawn today and your tests are completely normal, you will receive your results only by: MyChart Message (if you have MyChart) OR A paper copy in the mail If you have any lab test that is abnormal or we need to change your treatment, we will call you to review the results.   Testing/Procedures: None ordered   Follow-Up: At [facility], you and your health needs are our priority.  As part of our continuing mission to provide you with exceptional heart care, we have created designated Provider Care Teams.  These Care Teams include your primary Cardiologist (physician) and Advanced Practice Providers (APPs -  Physician Assistants and Nurse Practitioners) who all work together to provide you with the care you need, when you need it.  We recommend signing up for the patient portal called "MyChart".  Sign up information is provided on this After Visit Summary.  MyChart is used to connect with patients for Virtual Visits (Telemedicine).  Patients are able to view lab/test results, encounter notes, upcoming appointments, etc.  Non-urgent messages can be sent to your provider as well.   To learn more about what you can do with MyChart, go to [email or web address].    Your next appointment:   6 month(s)  Provider:   You may see Debbe Odea, MD or one of the following Advanced Practice Providers on your designated Care Team:   Nicolasa Ducking, NP Eula Listen, PA-C Cadence Fransico Michael, PA-C Charlsie Quest, NP Carlos Levering, NP

## 2022-12-16 ENCOUNTER — Encounter: Payer: Self-pay | Admitting: Emergency Medicine

## 2022-12-16 LAB — BASIC METABOLIC PANEL
BUN/Creatinine Ratio: 15 (ref 12–28)
BUN: 15 mg/dL (ref 8–27)
CO2: 25 mmol/L (ref 20–29)
Calcium: 11 mg/dL — ABNORMAL HIGH (ref 8.7–10.3)
Chloride: 103 mmol/L (ref 96–106)
Creatinine, Ser: 0.99 mg/dL (ref 0.57–1.00)
Glucose: 95 mg/dL (ref 70–99)
Potassium: 3.8 mmol/L (ref 3.5–5.2)
Sodium: 141 mmol/L (ref 134–144)
eGFR: 63 mL/min/{1.73_m2} (ref >59)

## 2022-12-16 LAB — MAGNESIUM: Magnesium: 1.9 mg/dL (ref 1.6–2.3)

## 2022-12-16 LAB — CBC
Hematocrit: 38.6 % (ref 34.0–46.6)
Hemoglobin: 12.7 g/dL (ref 11.1–15.9)
MCH: 30 pg (ref 26.6–33.0)
MCHC: 32.9 g/dL (ref 31.5–35.7)
MCV: 91 fL (ref 79–97)
Platelets: 266 10*3/uL (ref 150–450)
RBC: 4.24 x10E6/uL (ref 3.77–5.28)
RDW: 12.4 % (ref 11.7–15.4)
WBC: 4.4 10*3/uL (ref 3.4–10.8)

## 2022-12-16 LAB — TSH: TSH: 1.66 u[IU]/mL (ref 0.450–4.500)

## 2023-01-26 DIAGNOSIS — M545 Low back pain, unspecified: Secondary | ICD-10-CM | POA: Diagnosis not present

## 2023-01-26 DIAGNOSIS — M4312 Spondylolisthesis, cervical region: Secondary | ICD-10-CM | POA: Diagnosis not present

## 2023-01-26 DIAGNOSIS — M4802 Spinal stenosis, cervical region: Secondary | ICD-10-CM | POA: Diagnosis not present

## 2023-01-26 DIAGNOSIS — M4712 Other spondylosis with myelopathy, cervical region: Secondary | ICD-10-CM | POA: Diagnosis not present

## 2023-02-04 DIAGNOSIS — M65312 Trigger thumb, left thumb: Secondary | ICD-10-CM | POA: Diagnosis not present

## 2023-02-05 ENCOUNTER — Telehealth: Payer: Self-pay | Admitting: Cardiology

## 2023-02-05 MED ORDER — CARVEDILOL 12.5 MG PO TABS: 12.5000 mg | ORAL_TABLET | Freq: Two times a day (BID) | ORAL | 2 refills | Status: AC

## 2023-02-05 NOTE — Telephone Encounter (Signed)
Requested Prescriptions   Signed Prescriptions Disp Refills   carvedilol (COREG) 12.5 MG tablet 180 tablet 2    Sig: Take 1 tablet (12.5 mg total) by mouth 2 (two) times daily.    Authorizing Provider: Debbe Odea    Ordering User: Kendrick Fries

## 2023-02-05 NOTE — Telephone Encounter (Signed)
*  STAT* If patient is at the pharmacy, call can be transferred to refill team.   1. Which medications need to be refilled? (please list name of each medication and dose if known) carvedilol (COREG) 12.5 MG tablet   2. Which pharmacy/location (including street and city if local pharmacy) is medication to be sent to?  [facility] [address], [place] - [address]    3. Do they need a 30 day or 90 day supply? 90

## 2023-04-15 ENCOUNTER — Ambulatory Visit: Admitting: Podiatry

## 2023-04-29 ENCOUNTER — Ambulatory Visit (INDEPENDENT_AMBULATORY_CARE_PROVIDER_SITE_OTHER)

## 2023-04-29 ENCOUNTER — Ambulatory Visit: Admitting: Podiatry

## 2023-04-29 ENCOUNTER — Encounter: Payer: Self-pay | Admitting: Podiatry

## 2023-04-29 DIAGNOSIS — M2041 Other hammer toe(s) (acquired), right foot: Secondary | ICD-10-CM

## 2023-04-29 DIAGNOSIS — M21612 Bunion of left foot: Secondary | ICD-10-CM | POA: Diagnosis not present

## 2023-04-29 DIAGNOSIS — M2042 Other hammer toe(s) (acquired), left foot: Secondary | ICD-10-CM | POA: Diagnosis not present

## 2023-04-29 DIAGNOSIS — M7752 Other enthesopathy of left foot: Secondary | ICD-10-CM | POA: Diagnosis not present

## 2023-04-29 MED ORDER — TRIAMCINOLONE ACETONIDE 10 MG/ML IJ SUSP
10.0000 mg | Freq: Once | INTRAMUSCULAR | Status: AC
  Administered 2023-04-29: 10 mg via INTRA_ARTICULAR

## 2023-04-30 NOTE — Progress Notes (Signed)
 Subjective:   Patient ID: Dickie La, female   DOB: 68 y.o.   MRN: [id number]   HPI Patient presents with significant structural deformity of the left over right second toe with inflammation fluid buildup and worsening of condition with structural hyperostosis first metatarsal head left over right painful.  Patient states has gotten much worse over the last couple months and she has tried over-the-counter medication and does not smoke likes to be active   Review of Systems  All other systems reviewed and are negative.       Objective:  Physical Exam Vitals and nursing note reviewed.  Constitutional:      Appearance: She is well-developed.  Pulmonary:     Effort: Pulmonary effort is normal.  Musculoskeletal:        General: Normal range of motion.  Skin:    General: Skin is warm.  Neurological:     Mental Status: She is alert.     Neurovascular status intact muscle strength found to be adequate range of motion adequate with patient found to have rigid contracture at digit to left over right with structural deformity first metatarsal head left deviation of the big toe pressing against the second toe with pain.  Fluid buildup around the inner phalangeal joint left second toe very painful when pressed with good digital perfusion well-oriented     Assessment:  Significant digital deformity left over right second toe with structural bunion deformity left over right and inflammation of the second digit left     Plan:  H&P x-rays reviewed and explained and at this point I did discuss digital fusion with bunion correction left that she wants to do later in the year.  Will get a first focus on the toe itself and I did do careful sterile prep I injected the inner phalangeal joint 2 mg dexamethasone Kenalog 5 mg Xylocaine I then did courtesy sterile sharp debridement of the lesion I applied dressing and cushioning and reviewed the x-rays with her  X-rays indicate significant bunion  deformity left over right with elevation of the intermetatarsal angle left 15 degrees and severe elevation second digit left open right

## 2023-06-14 NOTE — Progress Notes (Unsigned)
 Cardiology Clinic Note   Date: 06/16/2023 ID: Marguerite, Barba 12-28-1955, MRN [id number]  Primary Cardiologist:  Constancia Delton, MD  Chief Complaint   Sara Lara is a 68 y.o. female who presents to the clinic today for routine follow up.   Patient Profile   Sara Lara is followed by Dr. Junnie Olives for the history outlined below.      Past medical history significant for: Hypertension. PACs. Cardiac murmur. Echo 11/20/2020: EF 60 to 65%.  No RWMA.  Grade I DD.  Normal global strain.  Normal RV size/function.  Normal PA pressure.  Mild MR. Chronic neck pain. Breast cancer. Syncope.  In summary, patient was first evaluated by Dr. Junnie Olives on 10/15/2020 for PACs at the request of Barnet Lias, PA-C.  Patient developed left arm pain and myalgias after second COVID-vaccine July 2022.  She presented to the ED and EKG showed PACs.  It was recommended she follow-up with cardiology.  Systolic murmur was auscultated during exam.  Patient underwent echo which showed normal LV/RV function as detailed above.   Patient was seen in the office by Dr. Junnie Olives on 05/15/2022 after ED visit for syncope.  Patient reported no further episodes of syncope at that time.  Patient reported on the day of syncopal episode she developed abdominal pain while sitting in her home work desk and suddenly feeling weak and dizzy she reports she did not lose consciousness.  Symptoms improved after bowel movement.  She reports her brother who ate the same meal also presented to the ED.  Her ED workup was unremarkable.  At the time of her visit she noted stopping amlodipine  secondary to lower extremity edema.  BP was elevated in the office but improved.  Medications were continued and she was instructed to follow a low sodium diet.   Patient was last seen in the office by me on 12/15/2022 for routine follow-up.  She denied any further syncopal episodes.  She did report an episode of chest discomfort radiating to  her arm this was preceded by feeling as though her heart was not beating normally.  Pain lasted 1 to 2 minutes and resolved on its own.  She reported not hydrating well for several days prior to episode.  EKG demonstrated frequent PVCs.  She felt discomfort was related to palpitations and wanted to defer further workup.  She did undergo a lab work which was normal.     History of Present Illness    Today, patient reports she is doing well. Patient denies shortness of breath, dyspnea on exertion, lower extremity edema, orthopnea or PND. No chest pain, pressure, or tightness. Occasional palpitations.  She retired in January and is keeping busy doing small projects at home. Her activity has been limited for the last couple of years after developing neuropathy and gait imbalance after a Covid booster. She has been trying to stay consistent with chair exercises at home and plans to return to physical therapy for balance training and strengthening. She does have stairs at home which she can navigate without dyspnea. She reports BP is well controlled at home. She restricts dietary sodium and avoids processed foods.       ROS: All other systems reviewed and are otherwise negative except as noted in History of Present Illness.  EKGs/Labs Reviewed    EKG Interpretation Date/Time:  Tuesday Jun 16 2023 15:31:38 EDT Ventricular Rate:  70 PR Interval:  144 QRS Duration:  90 QT Interval:  408 QTC Calculation: 440  R Axis:   27  Text Interpretation: Sinus rhythm with Premature atrial complexes Septal infarct (cited on or before 05-Oct-2019) When compared with ECG of 15-Dec-2022 15:10, Premature ventricular complexes are no longer Present Premature atrial complexes are now Present Confirmed by Morey Ar [phone]) on 06/16/2023 3:34:25 PM   12/15/2022: BUN 15; Creatinine, Ser 0.99; Potassium 3.8; Sodium 141   12/15/2022: Hemoglobin 12.7; WBC 4.4   12/15/2022: TSH 1.660    Physical Exam    VS:  BP  138/80   Pulse 70   Ht 5\' 4"  (1.626 m)   Wt 179 lb 6.4 oz (81.4 kg)   SpO2 96%   BMI 30.79 kg/m  , BMI Body mass index is 30.79 kg/m.  GEN: Well nourished, well developed, in no acute distress. Neck: No JVD or carotid bruits. Cardiac:  RRR.Occasional extrasystole. 2/6 systolic murmur. No rubs or gallops.   Respiratory:  Respirations regular and unlabored. Clear to auscultation without rales, wheezing or rhonchi. GI: Soft, nontender, nondistended. Extremities: Radials/DP/PT 2+ and equal bilaterally. No clubbing or cyanosis. No edema.  Skin: Warm and dry, no rash. Neuro: Strength intact.  Assessment & Plan   Palpitations Patient reports occasional palpitations. EKG shows sinus rhythm with PACs. She is working on improving her hydration. She is finds this a little more difficult to keep up with since she retired as she is not sitting at a desk with water right next to her. Occasional extrasystole on exam today.  - Continue carvedilol .  Cardiac murmur Echo November 2022  showed normal LV/RV function, grade I DD, normal PA pressure, mild MR. No lower extremity edema, dyspnea, lightheadedness, dizziness, presyncope or syncope. 2/6 systolic murmur auscultated today.  - Repeat echo as clinically indicated.    Hypertension BP today 138/80 on intake and 132/80 on my recheck. - Increase physical activity as tolerated.   -Continue carvedilol , valsartan .  Disposition: CBC and BMP today. Return in 6 months or sooner as needed.          Signed, Lonell Rives. Gessica Jawad, DNP, NP-C

## 2023-06-16 ENCOUNTER — Encounter: Payer: Self-pay | Admitting: Student

## 2023-06-16 ENCOUNTER — Ambulatory Visit: Payer: Self-pay | Attending: Student | Admitting: Student

## 2023-06-16 VITALS — BP 138/80 | HR 70 | Ht 64.0 in | Wt 179.4 lb

## 2023-06-16 DIAGNOSIS — R002 Palpitations: Secondary | ICD-10-CM

## 2023-06-16 DIAGNOSIS — Z79899 Other long term (current) drug therapy: Secondary | ICD-10-CM | POA: Diagnosis not present

## 2023-06-16 DIAGNOSIS — I10 Essential (primary) hypertension: Secondary | ICD-10-CM

## 2023-06-16 DIAGNOSIS — R011 Cardiac murmur, unspecified: Secondary | ICD-10-CM | POA: Diagnosis not present

## 2023-06-16 NOTE — Patient Instructions (Signed)
 Medication Instructions:  Your Physician recommend you continue on your current medication as directed.    *If you need a refill on your cardiac medications before your next appointment, please call your pharmacy*  Lab Work: Your provider would like for you to have following labs drawn today BMet, CBC.   If you have labs (blood work) drawn today and your tests are completely normal, you will receive your results only by: MyChart Message (if you have MyChart) OR A paper copy in the mail If you have any lab test that is abnormal or we need to change your treatment, we will call you to review the results.  Testing/Procedures: None ordered at this time   Follow-Up: At [facility], you and your health needs are our priority.  As part of our continuing mission to provide you with exceptional heart care, our providers are all part of one team.  This team includes your primary Cardiologist (physician) and Advanced Practice Providers or APPs (Physician Assistants and Nurse Practitioners) who all work together to provide you with the care you need, when you need it.  Your next appointment:   6 month(s)  Provider:   You may see Constancia Delton, MD or one of the following Advanced Practice Providers on your designated Care Team:   Laneta Pintos, NP Gildardo Labrador, PA-C Varney Gentleman, PA-C Cadence [place], PA-C Ronald Cockayne, NP Morey Ar, NP    We recommend signing up for the patient portal called "MyChart".  Sign up information is provided on this After Visit Summary.  MyChart is used to connect with patients for Virtual Visits (Telemedicine).  Patients are able to view lab/test results, encounter notes, upcoming appointments, etc.  Non-urgent messages can be sent to your provider as well.   To learn more about what you can do with MyChart, go to [email or web address].

## 2023-06-17 ENCOUNTER — Ambulatory Visit: Payer: Self-pay | Admitting: Student

## 2023-06-17 LAB — BASIC METABOLIC PANEL WITH GFR
BUN/Creatinine Ratio: 21 (ref 12–28)
BUN: 22 mg/dL (ref 8–27)
CO2: 23 mmol/L (ref 20–29)
Calcium: 11 mg/dL — ABNORMAL HIGH (ref 8.7–10.3)
Chloride: 105 mmol/L (ref 96–106)
Creatinine, Ser: 1.03 mg/dL — ABNORMAL HIGH (ref 0.57–1.00)
Glucose: 98 mg/dL (ref 70–99)
Potassium: 4.4 mmol/L (ref 3.5–5.2)
Sodium: 142 mmol/L (ref 134–144)
eGFR: 60 mL/min/{1.73_m2} (ref >59)

## 2023-06-17 LAB — CBC
Hematocrit: 38.1 % (ref 34.0–46.6)
Hemoglobin: 12.4 g/dL (ref 11.1–15.9)
MCH: 29.8 pg (ref 26.6–33.0)
MCHC: 32.5 g/dL (ref 31.5–35.7)
MCV: 92 fL (ref 79–97)
Platelets: 275 10*3/uL (ref 150–450)
RBC: 4.16 x10E6/uL (ref 3.77–5.28)
RDW: 12.4 % (ref 11.7–15.4)
WBC: 5.3 10*3/uL (ref 3.4–10.8)

## 2023-06-19 NOTE — Progress Notes (Signed)
 Last read by Clyde Darling at 5:18PM on 06/18/2023.

## 2023-06-24 ENCOUNTER — Encounter: Payer: Self-pay | Admitting: Podiatry

## 2023-06-24 ENCOUNTER — Ambulatory Visit: Admitting: Podiatry

## 2023-06-24 VITALS — Ht 64.0 in | Wt 179.0 lb

## 2023-06-24 DIAGNOSIS — M2041 Other hammer toe(s) (acquired), right foot: Secondary | ICD-10-CM

## 2023-06-24 DIAGNOSIS — M21612 Bunion of left foot: Secondary | ICD-10-CM

## 2023-06-24 DIAGNOSIS — M2042 Other hammer toe(s) (acquired), left foot: Secondary | ICD-10-CM | POA: Diagnosis not present

## 2023-06-24 NOTE — Progress Notes (Signed)
 Subjective:   Patient ID: Sara Lara, female   DOB: 68 y.o.   MRN: [id number]   HPI Patient states she wants to do surgery on her left foot first over her right foot and states that the bunion has been sore and the toe has been very sore and she has tried wider shoes we tried trimming we tried medication neuro   ROS      Objective:  Physical Exam  Ocular status intact muscle strength adequate with the patient noted to have significant rigid contracture digit to left structural bunion deformity bilateral and digital deformity right.     Assessment:  Chronic hammertoe deformity structural bunion deformity left over right     Plan:  H&P both conditions reviewed and at this point I am focusing on the left foot.  I recommended distal osteotomy and I went ahead today and discussed and allowed her to read consent form for this procedure digital fusion digit to left.  I explained all procedures I explained alternative treatments risk and she wants surgery signed consent form.  Patient scheduled outpatient surgery all questions answered today and I did dispense a pair fracture walker that we properly fitted to her lower legs and I would like her to wear it prior to surgery in order to be balanced and to find a shoe that works on the other foot

## 2023-07-02 ENCOUNTER — Ambulatory Visit: Admitting: Neurology

## 2023-07-02 ENCOUNTER — Telehealth: Payer: Self-pay | Admitting: Neurology

## 2023-07-02 NOTE — Telephone Encounter (Signed)
 Today's appointment has been r/s due to a conflict

## 2023-07-06 ENCOUNTER — Telehealth: Payer: Self-pay | Admitting: Podiatry

## 2023-07-06 NOTE — Telephone Encounter (Signed)
 DOS 07/14/23   CPT 28285- NO AUTH NEEDED  CPT 28296- NO AUTH NEEDED  DEDUCTIBLE 0  OUT OF POCKET 3000.00 MET 102.66  SURGERY COPAY IS 400.00   PER [place] AT [employer] NO AUTH NEEDED   REF # [id number]

## 2023-07-13 MED ORDER — HYDROCODONE-ACETAMINOPHEN 10-325 MG PO TABS: 1.0000 | ORAL_TABLET | Freq: Three times a day (TID) | ORAL | 0 refills | Status: DC | PRN

## 2023-07-13 MED ORDER — HYDROMORPHONE HCL 4 MG PO TABS: 4.0000 mg | ORAL_TABLET | ORAL | 0 refills | Status: DC | PRN

## 2023-07-13 MED ORDER — ONDANSETRON HCL 4 MG PO TABS: 4.0000 mg | ORAL_TABLET | Freq: Three times a day (TID) | ORAL | 0 refills | Status: DC | PRN

## 2023-07-13 NOTE — Addendum Note (Signed)
 Addended by: MAGDALEN PASCO RAMAN on: 07/13/2023 05:13 PM   Modules accepted: Orders

## 2023-07-14 DIAGNOSIS — M21612 Bunion of left foot: Secondary | ICD-10-CM | POA: Diagnosis not present

## 2023-07-14 DIAGNOSIS — M2012 Hallux valgus (acquired), left foot: Secondary | ICD-10-CM | POA: Diagnosis not present

## 2023-07-14 DIAGNOSIS — M2042 Other hammer toe(s) (acquired), left foot: Secondary | ICD-10-CM | POA: Diagnosis not present

## 2023-07-20 ENCOUNTER — Ambulatory Visit (INDEPENDENT_AMBULATORY_CARE_PROVIDER_SITE_OTHER): Admitting: Podiatry

## 2023-07-20 ENCOUNTER — Encounter: Payer: Self-pay | Admitting: Podiatry

## 2023-07-20 ENCOUNTER — Ambulatory Visit (INDEPENDENT_AMBULATORY_CARE_PROVIDER_SITE_OTHER)

## 2023-07-20 VITALS — Ht 64.0 in | Wt 179.0 lb

## 2023-07-20 DIAGNOSIS — M21612 Bunion of left foot: Secondary | ICD-10-CM

## 2023-07-20 NOTE — Progress Notes (Signed)
 Subjective:   Patient ID: Sara Lara Moles, female   DOB: 68 y.o.   MRN: [id number]   HPI Patient states doing very well with surgery having mild discomfort controlled with ibuprofen.  Has been wearing the boot dressing attached   ROS      Objective:  Physical Exam  Neurovascular status intact negative Homans' sign noted wound edges coapted well hallux rectus position pin intact second toe     Assessment:  Doing well post osteotomy left first metatarsal digital fusion digit 2     Plan:  H&P reviewed went ahead today reapplied sterile dressing continue elevation compression immobilization dispensed surgical shoe reappoint 2 weeks suture removal earlier if needed  X-rays indicate osteotomies healing well fixation in place joint congruence very slight varus so we will keep the toe at a over valgus position at this time which I explained to her and her husband how to do

## 2023-07-22 ENCOUNTER — Ambulatory Visit (INDEPENDENT_AMBULATORY_CARE_PROVIDER_SITE_OTHER): Admitting: Podiatry

## 2023-07-22 ENCOUNTER — Encounter: Payer: Self-pay | Admitting: Podiatry

## 2023-07-22 ENCOUNTER — Other Ambulatory Visit: Payer: Self-pay | Admitting: Podiatry

## 2023-07-22 ENCOUNTER — Ambulatory Visit (INDEPENDENT_AMBULATORY_CARE_PROVIDER_SITE_OTHER)

## 2023-07-22 DIAGNOSIS — M2042 Other hammer toe(s) (acquired), left foot: Secondary | ICD-10-CM

## 2023-07-22 DIAGNOSIS — M2041 Other hammer toe(s) (acquired), right foot: Secondary | ICD-10-CM | POA: Diagnosis not present

## 2023-07-22 DIAGNOSIS — Z9889 Other specified postprocedural states: Secondary | ICD-10-CM

## 2023-07-22 NOTE — Progress Notes (Signed)
 Subjective:   Patient ID: Sara Lara, female   DOB: 68 y.o.   MRN: [id number]   HPI Patient states she is doing very well but she is concerned because she bumped her second toe and the pin moved   ROS      Objective:  Physical Exam  Neurovascular status intact negative Toula' sign noted incision sites healed very well good alignment noted with patient found to have moderate trauma to the distal pin     Assessment:  Trauma to the distal pin difficult to say of any underlying condition has occurred from this     Plan:  H&P reviewed and at this point I move the pin back into a normal position advised on sterile dressing and will be seen back to review.  Keep normal appointment unless any issues were to occur  X-rays indicate pin is in place second digit pin in place first metatarsal good alignment noted

## 2023-07-25 DIAGNOSIS — R062 Wheezing: Secondary | ICD-10-CM | POA: Diagnosis not present

## 2023-07-25 DIAGNOSIS — Z5189 Encounter for other specified aftercare: Secondary | ICD-10-CM | POA: Diagnosis not present

## 2023-08-03 ENCOUNTER — Ambulatory Visit (INDEPENDENT_AMBULATORY_CARE_PROVIDER_SITE_OTHER)

## 2023-08-03 ENCOUNTER — Encounter: Payer: Self-pay | Admitting: Podiatry

## 2023-08-03 ENCOUNTER — Ambulatory Visit (INDEPENDENT_AMBULATORY_CARE_PROVIDER_SITE_OTHER): Admitting: Podiatry

## 2023-08-03 DIAGNOSIS — M21612 Bunion of left foot: Secondary | ICD-10-CM

## 2023-08-05 NOTE — Progress Notes (Signed)
 Subjective:   Patient ID: Sara Lara, female   DOB: 68 y.o.   MRN: [id number]   HPI Patient doing very well with surgery.  Pleased and having only mild pain with stitches intact at   ROS      Objective:  Physical Exam  Neurovascular status intact negative Homans' sign noted wound edges healed well digit good alignment pin intact with good range of motion first MPJ     Assessment:  Doing well post foot surgery left     Plan:  H&P x-ray taken stitches removal accomplished and sterile dressing reapplied.  Continue elevation and compression immobilization and reappoint 2 weeks for pin removal  X-rays indicate osteotomies healing well fixation looks good joint congruence toe good alignment

## 2023-08-27 ENCOUNTER — Ambulatory Visit (INDEPENDENT_AMBULATORY_CARE_PROVIDER_SITE_OTHER)

## 2023-08-27 ENCOUNTER — Ambulatory Visit: Admitting: Podiatry

## 2023-08-27 DIAGNOSIS — M21612 Bunion of left foot: Secondary | ICD-10-CM

## 2023-08-27 NOTE — Progress Notes (Signed)
 Subjective:   Patient ID: Sara Lara, female   DOB: 68 y.o.   MRN: [id number]   HPI Patient states she is doing well and here for pin removal overall feeling good about her foot   ROS      Objective:  Physical Exam  Neurovascular status intact with patient's left second toe pin in good position good position of the first metatarsal with good range of motion and incision sites healing well negative Toula' sign noted     Assessment:  Doing well post osteotomy and digital fusion left second toe     Plan:  H&P reviewed pin removed sterile dressing applied and x-ray reviewed.  At this point I applied ankle compression stocking continue elevation and gradual return to soft shoe gear and activities  X-rays indicate osteotomies healing very well toe and excellent alignment joint congruence very pleased at this point

## 2023-09-07 ENCOUNTER — Ambulatory Visit (INDEPENDENT_AMBULATORY_CARE_PROVIDER_SITE_OTHER): Admitting: Neurology

## 2023-09-07 ENCOUNTER — Encounter: Payer: Self-pay | Admitting: Neurology

## 2023-09-07 VITALS — BP 129/82 | HR 74 | Ht 64.0 in | Wt 174.0 lb

## 2023-09-07 DIAGNOSIS — M5442 Lumbago with sciatica, left side: Secondary | ICD-10-CM | POA: Insufficient documentation

## 2023-09-07 DIAGNOSIS — M79605 Pain in left leg: Secondary | ICD-10-CM | POA: Insufficient documentation

## 2023-09-07 MED ORDER — GABAPENTIN 300 MG PO CAPS: 900.0000 mg | ORAL_CAPSULE | Freq: Three times a day (TID) | ORAL | 6 refills | Status: AC

## 2023-09-07 MED ORDER — DULOXETINE HCL 60 MG PO CPEP: 60.0000 mg | ORAL_CAPSULE | Freq: Every day | ORAL | 11 refills | Status: AC

## 2023-09-07 NOTE — Progress Notes (Signed)
 Chief Complaint  Patient presents with   Follow-up    Rm13, alone, Neuropathy follow up: pt reported that her neuropathy is Constant, w/varying intensity. Left hip down to left foot predominantly and occasionally on the right.       ASSESSMENT AND PLAN  Sara Lara is a 68 y.o. female   History of cervical decompression for severe  C7- T1 stenosis in January 2023 History of fell landed on her left buttock region, now severe left midline gluteus region pain, radiating to posterior thigh and left foot, mainly distribution of left sciatic nerve,  MRI of lumbar spine to rule out lumbar radiculopathy,  Will also proceed with MRI of pelvic, left thigh to rule out left lumbar plexus and sciatic pathology  Depend on imaging study findings, may consider EMG nerve conduction study to localize the lesion    DIAGNOSTIC DATA (LABS, IMAGING, TESTING) - I reviewed patient records, labs, notes, testing and imaging myself where available. Laboratory evaluation in October 2022, WBC 3.9 hemoglobin of 12.2, B12 759 RPR was nonreactive normal ESR  MEDICAL HISTORY:  Sara Lara, is a 68 year old female, seen in request by her primary care PA Alben Therisa MATSU, for evaluation of gait abnormality, bilateral arm and leg paresthesia, she is accompanied by her husband at today's visit December 06, 2020   I reviewed and summarized the referring note. PMHX. HTN Right Breast cancer, 1991, surgery, chemo, radiation;   She contributed to her symptoms 2 days after her second COVID vaccination on August 18, 2020, 18 September 2020, she began to notice numb tingling down her left arm, deep achy pain, discomfort, later progressed to involving right arm, right hands, and few weeks later, she began to notice numbness tingling involving bilateral lower extremity, at the same time began to notice gait abnormality, fell few times, also worsening constipation, urinary frequency, urgency,  Her symptoms continue to progress, she  also began to have left perineal area numbness recently.  He noticed worsening bilateral hip pain, radiating towards inner thigh,  UPDATE August 18th 2025: She lost follow-up after initial visit, then was seen at [facility] in December 2022 for worsening gait abnormality, limb paresthesia, I ordered MRI of cervical spine on December 18, 2020,  She transferred her care to [facility], MRI showed cervical spine in [facility] in December 2022 showed severe canal stenosis C7-T1, posterior disc herniation result in advanced canal stenosis at C7-T1 with cord compression and intramedullary edema.  There was no substantial canal stenosis in the thoracic or lumbar spine.   She had cervical decompression at [facility] in January 2023, which did help her neck pain, right lower extremity difficulty, that she continue has gait abnormality  She fell and landed on her left buttock in 2009, over the years, she had slow worsening left buttock region pain, radiating to left half of perineal region, left posterior thigh and, bottom of left foot, worsening with weightbearing, she had left second toe surgery on July 10, 2023, wear a boot,  She has been using cane since her cervical decompression surgery  X-ray of bilateral hip in November 2022 showed no acute abnormality  MRI of the brain March 2024, essentially normal   PHYSICAL EXAM:   Vitals:   09/07/23 1331  BP: 129/82  Pulse: 74  Weight: 174 lb (78.9 kg)  Height: 5' 4 (1.626 m)   Body mass index is 29.87 kg/m.  PHYSICAL EXAMNIATION:  Gen: NAD, conversant, well nourised, well groomed  Cardiovascular: Regular rate rhythm, no peripheral edema, warm, nontender. Eyes: Conjunctivae clear without exudates or hemorrhage Neck: Supple, no carotid bruits. Pulmonary: Clear to auscultation bilaterally   NEUROLOGICAL EXAM:  MENTAL STATUS: Speech/cognition: Awake, alert, oriented to history taking and casual  conversation   CRANIAL NERVES: CN II: Visual fields are full to confrontation. Pupils are round equal and briskly reactive to light. CN III, IV, VI: extraocular movement are normal. No ptosis. CN V: Facial sensation is intact to light touch CN VII: Face is symmetric with normal eye closure  CN VIII: Hearing is normal to causal conversation. CN IX, X: Phonation is normal. CN XI: Head turning and shoulder shrug are intact  MOTOR: Mild bilateral shoulder abduction, hip flexion weakness  REFLEXES: Reflexes are 3 and symmetric at the biceps, triceps, knees, and ankles. Plantar responses are extensor on the right side, left side where boot,  SENSORY: Intact to light touch, pinprick and vibratory sensation are intact in fingers and toes.  COORDINATION: There is no trunk or limb dysmetria noted.  GAIT/STANCE: She needs push-up to get up from seated position, stiff, wearing left boot, reliant on her cane REVIEW OF SYSTEMS:  Full 14 system review of systems performed and notable only for as above All other review of systems were negative.   ALLERGIES: Allergies  Allergen Reactions   Darvon [Propoxyphene] Nausea And Vomiting   Milk-Related Compounds Other (See Comments)   Percocet [Oxycodone-Acetaminophen ] Nausea And Vomiting   Tramadol Nausea And Vomiting    HOME MEDICATIONS: Current Outpatient Medications  Medication Sig Dispense Refill   b complex vitamins capsule Take 1 capsule by mouth daily.     Calcium Carbonate (CALCIUM 600 PO) Take 600 mg by mouth 2 (two) times daily.     carvedilol  (COREG ) 12.5 MG tablet Take 1 tablet (12.5 mg total) by mouth 2 (two) times daily. 180 tablet 2   Cholecalciferol 125 MCG (5000 UT) capsule Take 5,000 Units by mouth daily.     cyanocobalamin  (VITAMIN B12) 1000 MCG tablet Take 1,000 mcg by mouth daily.     fluticasone (FLONASE) 50 MCG/ACT nasal spray Place into both nostrils as needed.     gabapentin  (NEURONTIN ) 300 MG capsule Take 2 capsules  (600 mg total) by mouth 3 (three) times daily. (Patient taking differently: Take 600 mg by mouth 5 (five) times daily.) 180 capsule 6   MAGNESIUM PO Take by mouth daily.     Misc Natural Products (BEET ROOT PO) Take 3 tablets by mouth daily.     Multiple Vitamin (MULTIVITAMIN ADULT PO) Take 1 tablet by mouth daily.     omega-3 acid ethyl esters (LOVAZA) 1 g capsule Take 2 g by mouth 2 (two) times daily.     POTASSIUM CHLORIDE PO Take 20 mEq by mouth daily.     valACYclovir (VALTREX) 500 MG tablet Take 500 mg by mouth 2 (two) times daily as needed.     valsartan  (DIOVAN ) 320 MG tablet Take 1 tablet (320 mg total) by mouth daily. 90 tablet 3   No current facility-administered medications for this visit.    PAST MEDICAL HISTORY: Past Medical History:  Diagnosis Date   Adverse reaction to COVID-19 vaccine 08/23/2020   PAC's after 4th covid vaccine pfizer   Allergy    seasonal   Anemia    when had breast cancer,1991   Arthritis    back,toe,ankles   Cancer (HCC)    right breast   Cataract    beginning stage   Heart  murmur    Hypertension    Neuromuscular disorder (HCC)    nerve pain to LLE since neck surgery   PAC (premature atrial contraction)    Sciatic nerve disease     PAST SURGICAL HISTORY: Past Surgical History:  Procedure Laterality Date   ANTERIOR CERVICAL DISCECTOMY & FUSION SINGLE LEVEL C7-T1     january 5 th 2023   BREAST LUMPECTOMY WITH AXILLARY LYMPH NODE DISSECTION Right    BREAST RECONSTRUCTION Right    bresat surgery Left    reduction   COLONOSCOPY  07/02/2021   COLONOSCOPY      FAMILY HISTORY: Family History  Problem Relation Age of Onset   Colon polyps Mother    Colon polyps Father    Crohn's disease Child    Colon cancer Neg Hx    Esophageal cancer Neg Hx    Rectal cancer Neg Hx    Stomach cancer Neg Hx     SOCIAL HISTORY: Social History   Socioeconomic History   Marital status: Divorced    Spouse name: Not on file   Number of children:  Not on file   Years of education: Not on file   Highest education level: Not on file  Occupational History   Not on file  Tobacco Use   Smoking status: Never    Passive exposure: Never   Smokeless tobacco: Never  Vaping Use   Vaping status: Never Used  Substance and Sexual Activity   Alcohol use: Never   Drug use: Never   Sexual activity: Not Currently    Birth control/protection: Post-menopausal  Other Topics Concern   Not on file  Social History Narrative   Not on file   Social Drivers of Health   Financial Resource Strain: Not on file  Food Insecurity: Not on file  Transportation Needs: Not on file  Physical Activity: Not on file  Stress: Not on file  Social Connections: Not on file  Intimate Partner Violence: Not on file      Modena Callander, M.D. Ph.D.  [facility] [address], [address] [place], [place] [address] Ph: [phone] Fax: [phone]  CC:  Alben Therisa MATSU, [place] [address] [place],  [place] [address]  Alben Therisa MATSU, [place]

## 2023-09-14 ENCOUNTER — Telehealth: Payer: Self-pay | Admitting: Neurology

## 2023-09-14 NOTE — Telephone Encounter (Signed)
 Submitted PA for all three MRIs at [facility]. [employer] medicare auth pending: [id number]       In Progress  Anticipated Determination Date: 09/24/2023

## 2023-09-22 ENCOUNTER — Other Ambulatory Visit: Payer: Self-pay | Admitting: Neurology

## 2023-09-22 ENCOUNTER — Ambulatory Visit (HOSPITAL_BASED_OUTPATIENT_CLINIC_OR_DEPARTMENT_OTHER)
Admission: RE | Admit: 2023-09-22 | Discharge: 2023-09-22 | Disposition: A | Discharge status: Home / Self Care | Source: Ambulatory Visit | Attending: Neurology | Admitting: Neurology

## 2023-09-22 ENCOUNTER — Ambulatory Visit (HOSPITAL_COMMUNITY)
Admission: RE | Admit: 2023-09-22 | Discharge: 2023-09-22 | Disposition: A | Discharge status: Home / Self Care | Source: Ambulatory Visit | Attending: Neurology | Admitting: Neurology

## 2023-09-22 DIAGNOSIS — M5442 Lumbago with sciatica, left side: Secondary | ICD-10-CM

## 2023-09-22 DIAGNOSIS — M79605 Pain in left leg: Secondary | ICD-10-CM | POA: Diagnosis not present

## 2023-09-22 DIAGNOSIS — R600 Localized edema: Secondary | ICD-10-CM | POA: Diagnosis not present

## 2023-09-22 DIAGNOSIS — G8929 Other chronic pain: Secondary | ICD-10-CM | POA: Diagnosis not present

## 2023-09-22 DIAGNOSIS — M7602 Gluteal tendinitis, left hip: Secondary | ICD-10-CM | POA: Diagnosis not present

## 2023-09-22 MED ORDER — GADOBUTROL 1 MMOL/ML IV SOLN
7.5000 mL | Freq: Once | INTRAVENOUS | Status: AC | PRN
  Administered 2023-09-22: 7.5 mL via INTRAVENOUS

## 2023-09-23 ENCOUNTER — Ambulatory Visit: Payer: Self-pay | Admitting: Neurology

## 2023-09-23 ENCOUNTER — Telehealth: Payer: Self-pay | Admitting: Neurology

## 2023-09-23 DIAGNOSIS — M5442 Lumbago with sciatica, left side: Secondary | ICD-10-CM

## 2023-09-23 DIAGNOSIS — M79605 Pain in left leg: Secondary | ICD-10-CM

## 2023-09-23 NOTE — Telephone Encounter (Signed)
 Lvm 1st attempt by hf 09/23/23

## 2023-09-23 NOTE — Telephone Encounter (Signed)
 Please call patient  MRI of left femur region showed evidence of mild tendinosis at origin of hamstrings, left worse than right, with slight enhancement.  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

## 2023-09-23 NOTE — Telephone Encounter (Signed)
 error

## 2023-09-24 NOTE — Addendum Note (Signed)
 Addended by: ONEITA HOIST E on: 09/24/2023 10:02 AM   Modules accepted: Orders

## 2023-09-24 NOTE — Addendum Note (Signed)
 Addended by: Madlyn Crosby on: 09/24/2023 03:17 PM   Modules accepted: Orders

## 2023-09-24 NOTE — Telephone Encounter (Signed)
 Orders Placed This Encounter  Procedures   Ambulatory referral to Physical Therapy   Nerve conduction test

## 2023-09-24 NOTE — Telephone Encounter (Signed)
 Pt is returning call to CMA

## 2023-09-24 NOTE — Telephone Encounter (Signed)
 Pt called in and stated that she understands the results from mychart message and that she would like to proceed w/PT and would like to do it at rehab here if possible. Pt also agreeable to [facility] EMG  Pt would like a referral to get lipoma checked out.  Pt is requesting an mri of buttocks due to pain upon sitting down on left side   Informed pt that I would route this to Dr. Onita.

## 2023-09-30 ENCOUNTER — Ambulatory Visit (INDEPENDENT_AMBULATORY_CARE_PROVIDER_SITE_OTHER): Admitting: Neurology

## 2023-09-30 ENCOUNTER — Encounter: Payer: Self-pay | Admitting: Neurology

## 2023-09-30 DIAGNOSIS — M5442 Lumbago with sciatica, left side: Secondary | ICD-10-CM

## 2023-09-30 DIAGNOSIS — M79605 Pain in left leg: Secondary | ICD-10-CM | POA: Diagnosis not present

## 2023-09-30 MED ORDER — CELECOXIB 100 MG PO CAPS: 100.0000 mg | ORAL_CAPSULE | Freq: Two times a day (BID) | ORAL | 3 refills | Status: AC | PRN

## 2023-09-30 NOTE — Procedures (Signed)
 Full Name: Sara Lara Gender: Female MRN #: [id number] Date of Birth: 09-Feb-1955    Visit Date: 09/30/2023 10:24 Age: 68 Years Examining Physician: Onita Duos Referring Physician: Onita Duos Height: 5 feet 4 inch History: 68 year old female complains of left lower extremity pain since she fell few years ago, radiating to left lower extremity  Summary of the test: Nerve conduction study: Bilateral sural, superficial peroneal sensory responses were normal.  Bilateral peroneal to EDB, tibial motor responses were normal.  Tibial H reflexes were normal and symmetric.  Electromyography: Selected needle examination of bilateral lower extremity muscles lumbosacral paraspinal muscles were normal  Conclusion: This is a normal study.  There is no electrodiagnostic evidence of large fiber peripheral neuropathy, left lumbar radiculopathy.    ------------------------------- Duos Onita. M.D. Ph.D.   [facility] [address] [place], [place] [address] Tel: [phone] Fax: [phone]  Verbal informed consent was obtained from the patient, patient was informed of potential risk of procedure, including bruising, bleeding, hematoma formation, infection, muscle weakness, muscle pain, numbness, among others.        MNC    Nerve / Sites Muscle Latency Ref. Amplitude Ref. Rel Amp Segments Distance Velocity Ref. Area    ms ms mV mV %  cm m/s m/s mVms  R Peroneal - EDB     Ankle EDB 4.2 <=6.5 8.1 >=2.0 100 Ankle - EDB 9   25.6     Fib head EDB 9.0  8.0  99.5 Fib head - Ankle 24 51 >=44 27.6     Pop fossa EDB 11.0  8.4  105 Pop fossa - Fib head 11 55 >=44 29.2         Pop fossa - Ankle      L Peroneal - EDB     Ankle EDB 5.2 <=6.5 3.5 >=2.0 100 Ankle - EDB 9   13.2     Fib head EDB 10.9  3.4  96.7 Fib head - Ankle 25 44 >=44 14.3     Pop fossa EDB 13.3  2.3  67.9 Pop fossa - Fib head 10 42 >=44 8.6         Pop fossa - Ankle      R Tibial - AH     Ankle  AH 4.0 <=5.8 9.6 >=4.0 100 Ankle - AH 9   24.8     Pop fossa AH 12.6  7.9  82.3 Pop fossa - Ankle 38 44 >=41 22.1  L Tibial - AH     Ankle AH 5.2 <=5.8 8.9 >=4.0 100 Ankle - AH 9   15.5     Pop fossa AH 12.1  7.3  81.6 Pop fossa - Ankle 35 51 >=41 15.9             SNC    Nerve / Sites Rec. Site Peak Lat Ref.  Amp Ref. Segments Distance    ms ms V V  cm  R Sural - Ankle (Calf)     Calf Ankle 3.8 <=4.4 7 >=6 Calf - Ankle 14  L Sural - Ankle (Calf)     Calf Ankle 3.4 <=4.4 11 >=6 Calf - Ankle 14  L Superficial peroneal - Ankle     Lat leg Ankle 3.5 <=4.4 7 >=6 Lat leg - Ankle 14  R Superficial peroneal - Ankle     Lat leg Ankle 3.6 <=4.4 8 >=6 Lat leg - Ankle 14  F  Wave    Nerve F Lat Ref.   ms ms  R Tibial - AH 46.1 <=56.0  L Tibial - AH 48.1 <=56.0         H Reflex    Nerve H Lat Lat Hmax   ms ms   Left Right Ref. Left Right Ref.  Tibial - Soleus 35.9 36.5 <=35.0 29.3 33.3 <=35.0         EMG Summary Table    Spontaneous MUAP Recruitment  Muscle IA Fib PSW Fasc Other Amp Dur. Poly Pattern  L. Vastus lateralis Normal None None None _______ Normal Normal Normal Normal  L. Tibialis anterior Normal None None None _______ Normal Normal Normal Normal  L. Tibialis posterior Normal None None None _______ Normal Normal Normal Normal  L. Peroneus longus Normal None None None _______ Normal Normal Normal Normal  L. Gastrocnemius (Medial head) Normal None None None _______ Normal Normal Normal Normal  L. Adductor longus Normal None None None _______ Normal Normal Normal Normal  R. Tibialis anterior Normal None None None _______ Normal Normal Normal Normal  R. Tibialis posterior Normal None None None _______ Normal Normal Normal Normal  R. Peroneus longus Normal None None None _______ Normal Normal Normal Normal  R. Gastrocnemius (Medial head) Normal None None None _______ Normal Normal Normal Normal  R. Vastus lateralis Normal None None None _______ Normal Normal Normal  Normal  R. Biceps femoris (long head) Normal None None None _______ Normal Normal Normal Normal  R. Biceps femoris (short head) Normal None None None _______ Normal Normal Normal Normal  L. Biceps femoris (long head) Normal None None None _______ Normal Normal Normal Normal  L. Biceps femoris (short head) Normal None None None _______ Normal Normal Normal Normal  R. Lumbar paraspinals (low) Normal None None None _______ Normal Normal Normal Normal  R. Lumbar paraspinals (mid) Normal None None None _______ Normal Normal Normal Normal  L. Lumbar paraspinals (low) Normal None None None _______ Normal Normal Normal Normal  L. Lumbar paraspinals (mid) Normal None None None _______ Normal Normal Normal Normal

## 2023-09-30 NOTE — Progress Notes (Signed)
 Chief Complaint  Patient presents with   NCS    Rm 3 alone Pt is well    ASSESSMENT AND PLAN  Sara Lara is a 68 y.o. female   History of cervical decompression for severe  C7- T1 stenosis in January 2023 History of fell landed on her left buttock region in 2019, now severe left midline gluteus region pain, radiating to posterior thigh and left foot, mainly distribution of left sciatic nerve,  MRI of lumbar spine showed lower lumbar degenerative changes no significant foraminal canal stenosis  MRI of  pelvic and left femur showed tendinosis at the origin of the hamstring muscles, left worse than right, with mild enhancement, mild insertional tendinosis of the gluteus medius tendon, EMG nerve conduction study September 30, 2023 was essentially normal, no evidence of left lower extremity focal neuropathy, left lumbar radiculopathy, We will refer her to orthopedic for evaluation  for potential muscular skeletal etiology for her chronic complaints Celebrex  as needed Refer to physical therapy     DIAGNOSTIC DATA (LABS, IMAGING, TESTING) - I reviewed patient records, labs, notes, testing and imaging myself where available. Laboratory evaluation in October 2022, WBC 3.9 hemoglobin of 12.2, B12 759 RPR was nonreactive normal ESR  MEDICAL HISTORY:  Sara Lara, is a 68 year old female, seen in request by her primary care PA Alben Therisa MATSU, for evaluation of gait abnormality, bilateral arm and leg paresthesia, she is accompanied by her husband at today's visit December 06, 2020   I reviewed and summarized the referring note. PMHX. HTN Right Breast cancer, 1991, surgery, chemo, radiation;   She contributed to her symptoms 2 days after her second COVID vaccination on August 18, 2020, 18 September 2020, she began to notice numb tingling down her left arm, deep achy pain, discomfort, later progressed to involving right arm, right hands, and few weeks later, she began to notice numbness tingling  involving bilateral lower extremity, at the same time began to notice gait abnormality, fell few times, also worsening constipation, urinary frequency, urgency,  Her symptoms continue to progress, she also began to have left perineal area numbness recently.  He noticed worsening bilateral hip pain, radiating towards inner thigh,  UPDATE August 18th 2025: She lost follow-up after initial visit, then was seen at [facility] in December 2022 for worsening gait abnormality, limb paresthesia, I ordered MRI of cervical spine on December 18, 2020,  She transferred her care to [facility], MRI showed cervical spine in [facility] in December 2022 showed severe canal stenosis C7-T1, posterior disc herniation result in advanced canal stenosis at C7-T1 with cord compression and intramedullary edema.  There was no substantial canal stenosis in the thoracic or lumbar spine.   She had cervical decompression at [facility] in January 2023, which did help her neck pain, right lower extremity difficulty, that she continue has gait abnormality  She fell and landed on her left buttock in 2019, over the years, she had slow worsening left buttock region pain, radiating to left half of perineal region, left posterior thigh and, bottom of left foot, worsening with weightbearing, she had left second toe surgery on July 10, 2023, wear a boot,  She has been using cane since her cervical decompression surgery  X-ray of bilateral hip in November 2022 showed no acute abnormality  MRI of the brain March 2024, essentially normal  UPDATE Sept 10 2025: She return for electrodiagnostic study today, which was essentially normal, in specific, there was no evidence of  large fiber peripheral neuropathy, left lumbar radiculopathy or left lower extremity focal neuropathy  Had extensive imaging study in 2025  MRI of lumbar, severe facet arthropathy L4-5, L5-S1, no significant canal foraminal stenosis  MRI pelvic  and left femur with without contrast: Mild tendinosis at the origin of the hamstrings, left greater than right, with slight enhancement, no tear, mild insertional tendinosis of the gluteus medius tendons, incidentally noted is moderate size intramuscular lipoma at the right inner thigh, measuring 5.7 x 8.1 cm,  But she is apparently very bothered by her left lower extremity symptoms, put major limitation in her daily activity, gait abnormality, difficult to find a comfortable position to sleep, could not lie on her left side  She is already on polypharmacy treatment, Cymbalta  60 mg daily, gabapentin  up to 2700 mg daily, PHYSICAL EXAM:   Vitals:   09/30/23 1003  BP: (!) 147/87  Pulse: 81  Weight: 174 lb (78.9 kg)  Height: 5' 4 (1.626 m)   Body mass index is 29.87 kg/m.  PHYSICAL EXAMNIATION:  Gen: NAD, conversant, well nourised, well groomed                     Cardiovascular: Regular rate rhythm, no peripheral edema, warm, nontender. Eyes: Conjunctivae clear without exudates or hemorrhage Neck: Supple, no carotid bruits. Pulmonary: Clear to auscultation bilaterally   NEUROLOGICAL EXAM:  MENTAL STATUS: Speech/cognition: Awake, alert, oriented to history taking and casual conversation   CRANIAL NERVES: CN II: Visual fields are full to confrontation. Pupils are round equal and briskly reactive to light. CN III, IV, VI: extraocular movement are normal. No ptosis. CN V: Facial sensation is intact to light touch CN VII: Face is symmetric with normal eye closure  CN VIII: Hearing is normal to causal conversation. CN IX, X: Phonation is normal. CN XI: Head turning and shoulder shrug are intact  MOTOR: Mild bilateral shoulder abduction, hip flexion weakness  REFLEXES: Reflexes are 3 and symmetric at the biceps, triceps, knees, and ankles. Plantar responses are extensor on the right side, left side where boot,  SENSORY: Intact to light touch, pinprick and vibratory sensation are  intact in fingers and toes.  COORDINATION: There is no trunk or limb dysmetria noted.  GAIT/STANCE: She needs push-up to get up from seated position, stiff, wearing left boot, reliant on her cane REVIEW OF SYSTEMS:  Full 14 system review of systems performed and notable only for as above All other review of systems were negative.   ALLERGIES: Allergies  Allergen Reactions   Darvon [Propoxyphene] Nausea And Vomiting   Milk-Related Compounds Other (See Comments)   Percocet [Oxycodone-Acetaminophen ] Nausea And Vomiting   Tramadol Nausea And Vomiting    HOME MEDICATIONS: Current Outpatient Medications  Medication Sig Dispense Refill   celecoxib  (CELEBREX ) 100 MG capsule Take 1 capsule (100 mg total) by mouth 2 (two) times daily as needed. 60 capsule 3   b complex vitamins capsule Take 1 capsule by mouth daily.     Calcium Carbonate (CALCIUM 600 PO) Take 600 mg by mouth 2 (two) times daily.     carvedilol  (COREG ) 12.5 MG tablet Take 1 tablet (12.5 mg total) by mouth 2 (two) times daily. 180 tablet 2   Cholecalciferol 125 MCG (5000 UT) capsule Take 5,000 Units by mouth daily.     cyanocobalamin  (VITAMIN B12) 1000 MCG tablet Take 1,000 mcg by mouth daily.     DULoxetine  (CYMBALTA ) 60 MG capsule Take 1 capsule (60 mg total) by mouth  daily. 30 capsule 11   fluticasone (FLONASE) 50 MCG/ACT nasal spray Place into both nostrils as needed.     gabapentin  (NEURONTIN ) 300 MG capsule Take 3 capsules (900 mg total) by mouth 3 (three) times daily. 270 capsule 6   MAGNESIUM PO Take by mouth daily.     Misc Natural Products (BEET ROOT PO) Take 3 tablets by mouth daily.     Multiple Vitamin (MULTIVITAMIN ADULT PO) Take 1 tablet by mouth daily.     omega-3 acid ethyl esters (LOVAZA) 1 g capsule Take 2 g by mouth 2 (two) times daily.     POTASSIUM CHLORIDE PO Take 20 mEq by mouth daily.     valACYclovir (VALTREX) 500 MG tablet Take 500 mg by mouth 2 (two) times daily as needed.     valsartan  (DIOVAN )  320 MG tablet Take 1 tablet (320 mg total) by mouth daily. 90 tablet 3   No current facility-administered medications for this visit.    PAST MEDICAL HISTORY: Past Medical History:  Diagnosis Date   Adverse reaction to COVID-19 vaccine 08/23/2020   PAC's after 4th covid vaccine [employer]   Allergy    seasonal   Anemia    when had breast cancer,1991   Arthritis    back,toe,ankles   Cancer (HCC)    right breast   Cataract    beginning stage   Heart murmur    Hypertension    Neuromuscular disorder (HCC)    nerve pain to LLE since neck surgery   PAC (premature atrial contraction)    Sciatic nerve disease     PAST SURGICAL HISTORY: Past Surgical History:  Procedure Laterality Date   ANTERIOR CERVICAL DISCECTOMY & FUSION SINGLE LEVEL C7-T1     january 5 th 2023   BREAST LUMPECTOMY WITH AXILLARY LYMPH NODE DISSECTION Right    BREAST RECONSTRUCTION Right    bresat surgery Left    reduction   COLONOSCOPY  07/02/2021   COLONOSCOPY      FAMILY HISTORY: Family History  Problem Relation Age of Onset   Colon polyps Mother    Colon polyps Father    Crohn's disease Child    Colon cancer Neg Hx    Esophageal cancer Neg Hx    Rectal cancer Neg Hx    Stomach cancer Neg Hx     SOCIAL HISTORY: Social History   Socioeconomic History   Marital status: Divorced    Spouse name: Not on file   Number of children: Not on file   Years of education: Not on file   Highest education level: Not on file  Occupational History   Not on file  Tobacco Use   Smoking status: Never    Passive exposure: Never   Smokeless tobacco: Never  Vaping Use   Vaping status: Never Used  Substance and Sexual Activity   Alcohol use: Never   Drug use: Never   Sexual activity: Not Currently    Birth control/protection: Post-menopausal  Other Topics Concern   Not on file  Social History Narrative   Not on file   Social Drivers of Health   Financial Resource Strain: Not on file  Food  Insecurity: Not on file  Transportation Needs: Not on file  Physical Activity: Not on file  Stress: Not on file  Social Connections: Not on file  Intimate Partner Violence: Not on file      Modena Callander, M.D. Ph.D.  [facility] [address] [place], [place] [address] Ph: (  [phone] Fax: [phone]  CC:  Alben Therisa MATSU, [place] [address] [place],  [place] [address]  Alben Therisa MATSU, [place]

## 2023-10-01 ENCOUNTER — Ambulatory Visit: Admitting: Neurology

## 2023-10-01 ENCOUNTER — Telehealth: Payer: Self-pay | Admitting: Neurology

## 2023-10-01 NOTE — Telephone Encounter (Signed)
 Referral to Orthopedics faxed to [facility]   [employer]  Phone: [phone] Fax [phone]

## 2023-10-29 DIAGNOSIS — E785 Hyperlipidemia, unspecified: Secondary | ICD-10-CM | POA: Diagnosis not present

## 2023-10-29 DIAGNOSIS — G629 Polyneuropathy, unspecified: Secondary | ICD-10-CM | POA: Diagnosis not present

## 2023-10-29 DIAGNOSIS — E559 Vitamin D deficiency, unspecified: Secondary | ICD-10-CM | POA: Diagnosis not present

## 2023-10-29 DIAGNOSIS — I10 Essential (primary) hypertension: Secondary | ICD-10-CM | POA: Diagnosis not present

## 2023-10-29 DIAGNOSIS — Z0000 Encounter for general adult medical examination without abnormal findings: Secondary | ICD-10-CM | POA: Diagnosis not present

## 2023-11-09 DIAGNOSIS — Z1231 Encounter for screening mammogram for malignant neoplasm of breast: Secondary | ICD-10-CM | POA: Diagnosis not present

## 2023-11-09 DIAGNOSIS — Z1331 Encounter for screening for depression: Secondary | ICD-10-CM | POA: Diagnosis not present

## 2023-11-09 DIAGNOSIS — Z124 Encounter for screening for malignant neoplasm of cervix: Secondary | ICD-10-CM | POA: Diagnosis not present

## 2023-11-09 DIAGNOSIS — Z01419 Encounter for gynecological examination (general) (routine) without abnormal findings: Secondary | ICD-10-CM | POA: Diagnosis not present

## 2023-11-20 DIAGNOSIS — N760 Acute vaginitis: Secondary | ICD-10-CM | POA: Diagnosis not present

## 2023-12-05 NOTE — Progress Notes (Unsigned)
 Cardiology Clinic Note   Date: 12/08/2023 ID: Breland, Elders 1955-02-10, MRN [id number]  Primary Cardiologist:  Redell Cave, MD  Chief Complaint   Sara Lara is a 68 y.o. female who presents to the clinic today for routine follow up.   Patient Profile   Sara Lara is followed by Dr. Cave for the history outlined below.      Past medical history significant for: Hypertension. PACs. Cardiac murmur. Echo 11/20/2020: EF 60 to 65%.  No RWMA.  Grade I DD.  Normal global strain.  Normal RV size/function.  Normal PA pressure.  Mild MR. Chronic neck pain. Breast cancer. Syncope.  In summary, patient was first evaluated by Dr. Cave on 10/15/2020 for PACs at the request of Therisa Holm, PA-C.  Patient developed left arm pain and myalgias after second COVID-vaccine July 2022.  She presented to the ED and EKG showed PACs.  It was recommended she follow-up with cardiology.  Systolic murmur was auscultated during exam.  Patient underwent echo which showed normal LV/RV function as detailed above.   Patient was seen in the office by Dr. Cave on 05/15/2022 after ED visit for syncope.  Patient reported no further episodes of syncope at that time.  Patient reported on the day of syncopal episode she developed abdominal pain while sitting in her home work desk and suddenly feeling weak and dizzy she reports she did not lose consciousness.  Symptoms improved after bowel movement.  She reports her brother who ate the same meal also presented to the ED.  Her ED workup was unremarkable.  At the time of her visit she noted stopping amlodipine  secondary to lower extremity edema.  BP was elevated in the office but improved.  Medications were continued and she was instructed to follow a low sodium diet.   Patient was seen in the clinic on 12/15/2022 for routine follow-up.  She denied any further syncopal episodes.  She did report an episode of chest discomfort radiating to her arm  this was preceded by feeling as though her heart was not beating normally.  Pain lasted 1 to 2 minutes and resolved on its own.  She reported not hydrating well for several days prior to episode.  EKG demonstrated frequent PVCs.  She felt discomfort was related to palpitations and wanted to defer further workup.  She did undergo lab work which was normal.   Patient was last seen in the office by me on 06/16/2023 for routine follow-up.  She reported occasional palpitations but no other cardiac complaints.  No medication changes were made.     History of Present Illness    Today, patient is here alone. Patient denies shortness of breath, dyspnea on exertion, lower extremity edema, orthopnea or PND.  No palpitations. She reports 3 episodes of chest pain described as a sharp pain on the left side of her chest with and without exertion lasting seconds and resolving spontaneously. No other symptoms with discomfort. She was recently told she has high cholesterol. She reports she has not been taking valsartan  since July as she felt it was not helping her BP. She reports taking Beet pills that have reduced her BP. She also states she takes 2 tablets of carvedilol  once a day. She had one episode of lightheadedness that she has difficulty describing. Typically she does not experience lightheadedness, dizziness, presyncope or syncope.     ROS: All other systems reviewed and are otherwise negative except as noted in History of Present Illness.  EKGs/Labs Reviewed    EKG Interpretation Date/Time:  Tuesday December 08 2023 15:39:12 EST Ventricular Rate:  68 PR Interval:  142 QRS Duration:  122 QT Interval:  428 QTC Calculation: 455 R Axis:   -29  Text Interpretation: Sinus rhythm with Premature supraventricular complexes Left bundle branch block When compared with ECG of 16-Jun-2023 15:31, Left bundle branch block is now Present Criteria for Septal infarct are no longer Present Confirmed by Loistine Sober [id number]) on 12/08/2023 3:50:33 PM   06/16/2023: BUN 22; Creatinine, Ser 1.03; Potassium 4.4; Sodium 142   06/16/2023: Hemoglobin 12.4; WBC 5.3   12/15/2022: TSH 1.660    Risk Assessment/Calculations      HYPERTENSION CONTROL Vitals:   12/08/23 1530 12/08/23 1713  BP: (!) 188/118 (!) 146/90    The patient's blood pressure is elevated above target today.  In order to address the patient's elevated BP: A current anti-hypertensive medication was adjusted today.           Physical Exam    VS:  BP (!) 146/90 (BP Location: Left Arm, Patient Position: Sitting, Cuff Size: Large)   Pulse 68   Ht 5' 3 (1.6 m)   Wt 175 lb 3.2 oz (79.5 kg)   SpO2 98%   BMI 31.04 kg/m  , BMI Body mass index is 31.04 kg/m.  GEN: Well nourished, well developed, in no acute distress. Neck: No JVD or carotid bruits. Cardiac:  RRR. Extrasystole. 2/6 systolic murmur. No rubs or gallops.   Respiratory:  Respirations regular and unlabored. Clear to auscultation without rales, wheezing or rhonchi. GI: Soft, nontender, nondistended. Extremities: Radials/DP/PT 2+ and equal bilaterally. No clubbing or cyanosis. No edema   Skin: Warm and dry, no rash. Neuro: Strength intact.  Assessment & Plan   Palpitations Patient denies recent palpitations. EKG demonstrates sinus rhythm with PACs.  - Continue carvedilol  (see below).  Chest pain/LBBB Patient reports 3 episodes of sharp left sided chest pain with and without exertion lasting seconds and resolving on its own. No associated dyspnea. EKG today demonstrates sinus rhythm with PACs and LBBB. Bundle branch block is a new finding.  - Schedule coronary CTA.  - Schedule echo.  - BMP and CBC today.    Cardiac murmur Echo November 2022  showed normal LV/RV function, grade I DD, normal PA pressure, mild MR. No lower extremity edema, dyspnea, lightheadedness, dizziness, presyncope or syncope. 2/6 systolic murmur auscultated today. - Schedule echo.    Hypertension BP today 188/118 on intake and 146/90 on my recheck. Patient reports she stopped valsartan  in July as she felt it was not helping her BP. She reports getting normal readings at other doctor's visits and at home. She has been taking beet tablets and feels they control her BP best. She also reports taking 2 tablets of carvedilol  once time a day. Had a long discussion about importance of BP control. Patient will split dose of carvedilol  so she is taking it properly twice a day. She will send in 1 week of BP readings. If BP is elevated >130/80 will resume valsartan  at a lower dose depending on readings.  - Increase physical activity as tolerated.   - Take carvedilol  12.5 mg twice a day.  - MyChart one week of BP readings.   Disposition: Schedule echo and coronary CTA. CBC and BMP today. MyChart 1 week of BP readings. Return in 2-3 months after testing or sooner as needed.          Signed, Sober HERO. Ileen Kahre,  DNP, NP-C

## 2023-12-08 ENCOUNTER — Encounter: Payer: Self-pay | Admitting: Student

## 2023-12-08 ENCOUNTER — Ambulatory Visit: Attending: Student | Admitting: Student

## 2023-12-08 VITALS — BP 146/90 | HR 68 | Ht 63.0 in | Wt 175.2 lb

## 2023-12-08 DIAGNOSIS — R011 Cardiac murmur, unspecified: Secondary | ICD-10-CM | POA: Diagnosis not present

## 2023-12-08 DIAGNOSIS — I447 Left bundle-branch block, unspecified: Secondary | ICD-10-CM

## 2023-12-08 DIAGNOSIS — R002 Palpitations: Secondary | ICD-10-CM

## 2023-12-08 DIAGNOSIS — R072 Precordial pain: Secondary | ICD-10-CM | POA: Diagnosis not present

## 2023-12-08 DIAGNOSIS — I10 Essential (primary) hypertension: Secondary | ICD-10-CM

## 2023-12-08 MED ORDER — METOPROLOL TARTRATE 50 MG PO TABS: ORAL_TABLET | ORAL | 0 refills | Status: DC

## 2023-12-08 NOTE — Patient Instructions (Signed)
 Medication Instructions:  - take 100 mg metoprolol two hours prior to cardiac CT - send Sara Hila, NP one week of blood pressure readings. Take 1.5-2 hours after taking blood pressure medication  *If you need a refill on your cardiac medications before your next appointment, please call your pharmacy*  Lab Work: Your provider would like for you to have following labs drawn today CBC, BMP.   If you have labs (blood work) drawn today and your tests are completely normal, you will receive your results only by: MyChart Message (if you have MyChart) OR A paper copy in the mail If you have any lab test that is abnormal or we need to change your treatment, we will call you to review the results.  Testing/Procedures: Your physician has requested that you have an echocardiogram. Echocardiography is a painless test that uses sound waves to create images of your heart. It provides your doctor with information about the size and shape of your heart and how well your heart's chambers and valves are working.   You may receive an ultrasound enhancing agent through an IV if needed to better visualize your heart during the echo. This procedure takes approximately one hour.  There are no restrictions for this procedure.  This will take place at [address] [facility] [address], [place] [address]  Please note: We ask at that you not bring children with you during ultrasound (echo/ vascular) testing. Due to room size and safety concerns, children are not allowed in the ultrasound rooms during exams. Our front office staff cannot provide observation of children in our lobby area while testing is being conducted. An adult accompanying a patient to their appointment will only be allowed in the ultrasound room at the discretion of the ultrasound technician under special circumstances. We apologize for any inconvenience.   Your cardiac CT will be scheduled at:  [facility] [address] [place], [place] [address] [phone]  Please arrive 15 mins early for check-in and test prep.  There is spacious parking and easy access to the radiology department from the [facility] Heart and Vascular entrance. Please enter here and check-in with the desk attendant.    Please follow these instructions carefully (unless otherwise directed):  An IV will be required for this test and Nitroglycerin will be given.  Hold all erectile dysfunction medications at least 3 days (72 hrs) prior to test. (Ie viagra, cialis, sildenafil, tadalafil, etc)    On the Night Before the Test: Be sure to Drink plenty of water. Do not consume any caffeinated/decaffeinated beverages or chocolate 12 hours prior to your test. Do not take any antihistamines 12 hours prior to your test.  On the Day of the Test: Drink plenty of water until 1 hour prior to the test. Do not eat any food 1 hour prior to test. You may take your regular medications prior to the test.  Take metoprolol (Lopressor) two hours prior to test. If you take Furosemide/Hydrochlorothiazide/Spironolactone/Chlorthalidone, please HOLD on the morning of the test. Patients who wear a continuous glucose monitor MUST remove the device prior to scanning. FEMALES- please wear underwire-free bra if available, avoid dresses & tight clothing       After the Test: Drink plenty of water. After receiving IV contrast, you may experience a mild flushed feeling. This is normal. On occasion, you may experience a mild rash up to 24 hours after the test. This is not dangerous. If this occurs, you can  take Benadryl  25 mg, Zyrtec, Claritin, or Allegra and increase your fluid intake. (Patients taking Tikosyn should avoid Benadryl , and may take Zyrtec, Claritin, or Allegra) If you experience trouble breathing, this can be serious. If it is severe call 911 IMMEDIATELY. If it is mild, please call our office.  We will call to schedule your test  2-4 weeks out understanding that some insurance companies will need an authorization prior to the service being performed.   For more information and frequently asked questions, please visit our website : [email or web address]  For non-scheduling related questions, please contact the cardiac imaging nurse navigator should you have any questions/concerns: Cardiac Imaging Nurse Navigators Direct Office Dial: [phone]   For scheduling needs, including cancellations and rescheduling, please call Brittany, [phone].    Follow-Up: At [facility], you and your health needs are our priority.  As part of our continuing mission to provide you with exceptional heart care, our providers are all part of one team.  This team includes your primary Cardiologist (physician) and Advanced Practice Providers or APPs (Physician Assistants and Nurse Practitioners) who all work together to provide you with the care you need, when you need it.  Your next appointment:   2 month(s)  Provider:   You may see Redell Cave, MD or one of the following Advanced Practice Providers on your designated Care Team:   Sara Hila, NP   We recommend signing up for the patient portal called MyChart.  Sign up information is provided on this After Visit Summary.  MyChart is used to connect with patients for Virtual Visits (Telemedicine).  Patients are able to view lab/test results, encounter notes, upcoming appointments, etc.  Non-urgent messages can be sent to your provider as well.   To learn more about what you can do with MyChart, go to [email or web address].

## 2023-12-09 LAB — BASIC METABOLIC PANEL WITH GFR
BUN/Creatinine Ratio: 14 (ref 12–28)
BUN: 15 mg/dL (ref 8–27)
CO2: 26 mmol/L (ref 20–29)
Calcium: 11.3 mg/dL — ABNORMAL HIGH (ref 8.7–10.3)
Chloride: 102 mmol/L (ref 96–106)
Creatinine, Ser: 1.07 mg/dL — ABNORMAL HIGH (ref 0.57–1.00)
Glucose: 90 mg/dL (ref 70–99)
Potassium: 4.3 mmol/L (ref 3.5–5.2)
Sodium: 140 mmol/L (ref 134–144)
eGFR: 57 mL/min/1.73 — ABNORMAL LOW (ref >59)

## 2023-12-09 LAB — CBC
Hematocrit: 38.6 % (ref 34.0–46.6)
Hemoglobin: 12.4 g/dL (ref 11.1–15.9)
MCH: 29.7 pg (ref 26.6–33.0)
MCHC: 32.1 g/dL (ref 31.5–35.7)
MCV: 92 fL (ref 79–97)
Platelets: 259 x10E3/uL (ref 150–450)
RBC: 4.18 x10E6/uL (ref 3.77–5.28)
RDW: 12.9 % (ref 11.7–15.4)
WBC: 4.9 x10E3/uL (ref 3.4–10.8)

## 2024-01-08 ENCOUNTER — Telehealth (HOSPITAL_COMMUNITY): Payer: Self-pay | Admitting: *Deleted

## 2024-01-08 NOTE — Telephone Encounter (Signed)
 Attempted to call patient regarding upcoming cardiac CT appointment. Left message on voicemail with name and callback number Sid Seats RN Navigator Cardiac Imaging [facility] Services [phone] Office

## 2024-01-11 ENCOUNTER — Ambulatory Visit
Admission: RE | Admit: 2024-01-11 | Discharge: 2024-01-11 | Disposition: A | Discharge status: Home / Self Care | Source: Ambulatory Visit | Attending: Cardiology | Admitting: Cardiology

## 2024-01-11 DIAGNOSIS — R072 Precordial pain: Secondary | ICD-10-CM | POA: Diagnosis present

## 2024-01-11 MED ORDER — METOPROLOL TARTRATE 5 MG/5ML IV SOLN
10.0000 mg | Freq: Once | INTRAVENOUS | Status: DC | PRN
  Filled 2024-01-11: qty 10

## 2024-01-11 MED ORDER — DILTIAZEM HCL 25 MG/5ML IV SOLN
10.0000 mg | INTRAVENOUS | Status: DC | PRN
  Filled 2024-01-11: qty 5

## 2024-01-11 MED ORDER — NITROGLYCERIN 0.4 MG SL SUBL
0.8000 mg | SUBLINGUAL_TABLET | Freq: Once | SUBLINGUAL | Status: AC
  Administered 2024-01-11: 0.8 mg via SUBLINGUAL
  Filled 2024-01-11: qty 25

## 2024-01-11 MED ORDER — IOHEXOL 350 MG/ML SOLN
100.0000 mL | Freq: Once | INTRAVENOUS | Status: AC | PRN
  Administered 2024-01-11: 100 mL via INTRAVENOUS

## 2024-01-12 ENCOUNTER — Ambulatory Visit: Payer: Self-pay | Admitting: Cardiology

## 2024-01-15 ENCOUNTER — Ambulatory Visit: Attending: Cardiology

## 2024-01-15 DIAGNOSIS — R011 Cardiac murmur, unspecified: Secondary | ICD-10-CM

## 2024-01-15 LAB — ECHOCARDIOGRAM COMPLETE
AR max vel: 1.91 cm2
AV Area VTI: 1.95 cm2
AV Area mean vel: 1.9 cm2
AV Mean grad: 6 mmHg
AV Peak grad: 11.8 mmHg
Ao pk vel: 1.72 m/s
Area-P 1/2: 4.06 cm2
Calc EF: 53.1 %
S' Lateral: 3.7 cm
Single Plane A2C EF: 56.6 %
Single Plane A4C EF: 49.5 %

## 2024-02-10 NOTE — Progress Notes (Unsigned)
 "  Cardiology Clinic Note   Date: 02/10/2024 ID: [name], [name] 06/15/1955, MRN [id number]  Primary Cardiologist:  Redell Cave, MD  Chief Complaint   Sara Lara is a 69 y.o. female who presents to the clinic today for ***  Patient Profile   Sara Lara is followed by *** for the history outlined below.      Past medical history significant for: Hypertension. PACs. Cardiac murmur. Echo 11/20/2020: EF 60 to 65%.  No RWMA.  Grade I DD.  Normal global strain.  Normal RV size/function.  Normal PA pressure.  Mild MR. Chronic neck pain. Breast cancer. Syncope.  In summary, patient was first evaluated by Dr. Cave on 10/15/2020 for PACs at the request of Therisa Holm, PA-C.  Patient developed left arm pain and myalgias after second COVID-vaccine July 2022.  She presented to the ED and EKG showed PACs.  It was recommended she follow-up with cardiology.  Systolic murmur was auscultated during exam.  Patient underwent echo which showed normal LV/RV function as detailed above.   Patient was seen in the office by Dr. Cave on 05/15/2022 after ED visit for syncope.  Patient reported no further episodes of syncope at that time.  Patient reported on the day of syncopal episode she developed abdominal pain while sitting in her home work desk and suddenly feeling weak and dizzy she reports she did not lose consciousness.  Symptoms improved after bowel movement.  She reports her brother who ate the same meal also presented to the ED.  Her ED workup was unremarkable.  At the time of her visit she noted stopping amlodipine  secondary to lower extremity edema.  BP was elevated in the office but improved.  Medications were continued and she was instructed to follow a low sodium diet.   Patient was seen in the clinic on 12/15/2022 for routine follow-up.  She denied any further syncopal episodes.  She did report an episode of chest discomfort radiating to her arm this was preceded by feeling as  though her heart was not beating normally.  Pain lasted 1 to 2 minutes and resolved on its own.  She reported not hydrating well for several days prior to episode.  EKG demonstrated frequent PVCs.  She felt discomfort was related to palpitations and wanted to defer further workup.  She did undergo lab work which was normal.   Patient was last seen in the office by me on 06/16/2023 for routine follow-up.  She reported occasional palpitations but no other cardiac complaints.  No medication changes were made.   Patient was last seen in the office by ***     History of Present Illness    Today, patient ***  ***  ROS: All other systems reviewed and are otherwise negative except as noted in History of Present Illness.  EKGs/Labs Reviewed        12/08/2023: BUN 15; Creatinine, Ser 1.07; Potassium 4.3; Sodium 140   12/08/2023: Hemoglobin 12.4; WBC 4.9   No results found for requested labs within last 365 days.   No results found for requested labs within last 365 days.  ***  Risk Assessment/Calculations    {Does this patient have ATRIAL FIBRILLATION?:[id number]} No BP recorded.  {Refresh Note OR Click here to enter BP  :1}***        Physical Exam    VS:  There were no vitals taken for this visit. , BMI There is no height or weight on file to calculate BMI.  GEN: Well nourished, well developed, in no acute distress. Neck: No JVD or carotid bruits. Cardiac: *** RRR. *** No murmur. No rubs or gallops.   Respiratory:  Respirations regular and unlabored. Clear to auscultation without rales, wheezing or rhonchi. GI: Soft, nontender, nondistended. Extremities: Radials/DP/PT 2+ and equal bilaterally. No clubbing or cyanosis. No edema ***  Skin: Warm and dry, no rash. Neuro: Strength intact.  Assessment & Plan   ***  Disposition: ***     {Are you ordering a CV Procedure (e.g. stress test, cath, DCCV, TEE, etc)?   Press F2        :[id number]}   Signed, Barnie HERO. Shiri Hodapp, DNP,  NP-C  "

## 2024-02-11 NOTE — Progress Notes (Signed)
 "  Cardiology Clinic Note   Date: 02/19/2024 ID: Niaomi, Cartaya 28-Dec-1955, MRN [id number]  Primary Cardiologist:  Redell Cave, MD  Chief Complaint   Sara Lara is a 69 y.o. female who presents to the clinic today for follow up after testing.   Patient Profile   Sara Lara is followed by Dr. Cave for the history outlined below.      Past medical history significant for: Chest pain. Coronary CTA 01/11/2024: Coronary calcium score of 0 with no evidence of CAD. Hypertension. PACs. Mitral insufficiency.  Echo 01/15/2024: EF 50 to 55%.  Regional wall motion could not be evaluated.  Mild LVH.  Grade 2 DD.  Normal RV size/function.  Mildly elevated PA pressure, RVSP 37.1 mmHg.  Moderate LAE.  Small pericardial effusion.  Moderate MR. Chronic neck pain. Breast cancer. Syncope.  In summary, patient was first evaluated by Dr. Cave on 10/15/2020 for PACs at the request of Therisa Holm, PA-C.  Patient developed left arm pain and myalgias after second COVID-vaccine July 2022.  She presented to the ED and EKG showed PACs.  It was recommended she follow-up with cardiology.  Systolic murmur was auscultated during exam.  Patient underwent echo which showed normal LV/RV function as detailed above.   Patient was seen in the office by Dr. Cave on 05/15/2022 after ED visit for syncope.  Patient reported no further episodes of syncope at that time.  Patient reported on the day of syncopal episode she developed abdominal pain while sitting in her home work desk and suddenly feeling weak and dizzy she reports she did not lose consciousness.  Symptoms improved after bowel movement.  She reports her brother who ate the same meal also presented to the ED.  Her ED workup was unremarkable.  At the time of her visit she noted stopping amlodipine  secondary to lower extremity edema.  BP was elevated in the office but improved.  Medications were continued and she was instructed to follow a  low sodium diet.   Patient was seen in the clinic on 12/15/2022 for routine follow-up.  She denied any further syncopal episodes.  She did report an episode of chest discomfort radiating to her arm this was preceded by feeling as though her heart was not beating normally.  Pain lasted 1 to 2 minutes and resolved on its own.  She reported not hydrating well for several days prior to episode.  EKG demonstrated frequent PVCs.  She felt discomfort was related to palpitations and wanted to defer further workup.  She did undergo lab work which was normal.   Patient was last seen in the office by me on 12/08/2023 for routine follow-up.  She reported 3 episodes of sharp left-sided chest pain without exertion lasting seconds and resolving spontaneously.  She reported she had not been taking valsartan  since July as she felt it was not helping her BP and was instead taking beet pills.  She also reported taking 2 tablets of carvedilol  once a day.  BP was elevated at 188/118 on intake and 146/90 on my recheck.  She was instructed to begin taking carvedilol  twice a day.  She was also instructed to send in 1 week of BP readings through MyChart with plan to resume valsartan  if BP not at goal.  She underwent echo which showed normal LV function and moderate MR.  Coronary CTA showed calcium score of 0 with no evidence of CAD.     History of Present Illness  Today, patient is here alone. Patient denies shortness of breath, dyspnea on exertion, lower extremity edema, orthopnea or PND. She had one episode of sharp left sided chest pain since her last visit similar to previous pain. No palpitations.  Her BP is elevated today and she reports she did not take her medication. She has not been taking valsartan  for months, as she feels it never helped her BP. She states she limits sodium in her diet and if she eats out and feels there is a lot of salt in the food she will not eat it. She is under quite a bit of stress, as her son  has been started on dialysis due to poorly controlled BP. She is not as active as she would like to be. She has done [employer] in the past and really liked it. She and her husband want to get back into going to classes but with her son they have been very distracted.     ROS: All other systems reviewed and are otherwise negative except as noted in History of Present Illness.  EKGs/Labs Reviewed       EKG is not performed today.   12/08/2023: BUN 15; Creatinine, Ser 1.07; Potassium 4.3; Sodium 140   12/08/2023: Hemoglobin 12.4; WBC 4.9    Risk Assessment/Calculations      HYPERTENSION CONTROL Vitals:   02/19/24 1354 02/19/24 1516  BP: (!) 162/110 (!) 160/102    The patient's blood pressure is elevated above target today.  In order to address the patient's elevated BP: A new medication was prescribed today.           Physical Exam    VS:  BP (!) 160/102 (BP Location: Left Arm, Patient Position: Sitting, Cuff Size: Large)   Pulse 78   Resp 19   Ht 5' 3 (1.6 m)   Wt 168 lb 3.2 oz (76.3 kg)   SpO2 95%   BMI 29.80 kg/m  , BMI Body mass index is 29.8 kg/m.  GEN: Well nourished, well developed, in no acute distress. Neck: No JVD or carotid bruits. Cardiac:  RRR with occasional extrasystole. 2/6 systolic murmur. No rubs or gallops.   Respiratory:  Respirations regular and unlabored. Clear to auscultation without rales, wheezing or rhonchi. GI: Soft, nontender, nondistended. Extremities: Radials/DP/PT 2+ and equal bilaterally. No clubbing or cyanosis. No edema  Skin: Warm and dry, no rash. Neuro: Strength intact.  Assessment & Plan   Palpitations Patient denies recent palpitations. RRR with occasional extrasystole on exam today. She did not take carvedilol  today.  - Continue carvedilol .   Chest pain/LBBB Coronary CTA December 2025 demonstrated calcium score of 0 with no evidence of CAD.  Patient reports 1 episode of sharp left sided chest pain similar to  previous pain.  - Continue to monitor.    Mitral insufficiency Echo December 2025 demonstrated normal LV/RV function, mildly elevated PA pressure, mild LVH, Grade II DD, small pericardial effusion, moderate MR.  Patient denies lower extremity edema, orthopnea or PND. No lightheadedness, dizziness, presyncope or syncope. 2/6 systolic murmur auscultated today. - Repeat echo when clinically indicated.    Hypertension BP today 162/110 on intake and 160/102 on my recheck. She did not take carvedilol  today. She has not taken valsartan  for months. Home BP typically 130-150s/90-100s after taking carvedilol . Had a long discussion regarding the importance of BP control. She is willing to restart valsartan  but requests trying a lower dose. I think that is reasonable.  - Add valsartan  160 mg  daily.  - BMP today. - Repeat BMP in 2-3 weeks. - Continue carvedilol .  Disposition: BMP today. Start valsartan  160 mg daily. Repeat BMP 2-3 weeks. Return in 3 months or sooner as needed.          Signed, Barnie HERO. Denice Cardon, DNP, NP-C  "

## 2024-02-12 ENCOUNTER — Other Ambulatory Visit (HOSPITAL_COMMUNITY): Payer: Self-pay

## 2024-02-12 DIAGNOSIS — E213 Hyperparathyroidism, unspecified: Secondary | ICD-10-CM

## 2024-02-15 ENCOUNTER — Ambulatory Visit: Admitting: Student

## 2024-02-19 ENCOUNTER — Encounter: Payer: Self-pay | Admitting: Student

## 2024-02-19 ENCOUNTER — Ambulatory Visit: Attending: Student | Admitting: Student

## 2024-02-19 VITALS — BP 160/102 | HR 78 | Resp 19 | Ht 63.0 in | Wt 168.2 lb

## 2024-02-19 DIAGNOSIS — Z79899 Other long term (current) drug therapy: Secondary | ICD-10-CM

## 2024-02-19 DIAGNOSIS — I447 Left bundle-branch block, unspecified: Secondary | ICD-10-CM | POA: Diagnosis not present

## 2024-02-19 DIAGNOSIS — I340 Nonrheumatic mitral (valve) insufficiency: Secondary | ICD-10-CM

## 2024-02-19 DIAGNOSIS — R002 Palpitations: Secondary | ICD-10-CM | POA: Diagnosis not present

## 2024-02-19 DIAGNOSIS — I10 Essential (primary) hypertension: Secondary | ICD-10-CM

## 2024-02-19 DIAGNOSIS — R0789 Other chest pain: Secondary | ICD-10-CM

## 2024-02-19 MED ORDER — POTASSIUM CHLORIDE ER 20 MEQ PO TBCR: 20.0000 meq | EXTENDED_RELEASE_TABLET | Freq: Every day | ORAL | 3 refills | Status: AC

## 2024-02-19 MED ORDER — VALSARTAN 160 MG PO TABS: 160.0000 mg | ORAL_TABLET | Freq: Every day | ORAL | 3 refills | Status: AC

## 2024-02-19 NOTE — Patient Instructions (Signed)
 Your physician recommends the following medication changes.  START TAKING: Valsartan  (Diovan ) 160 mg once daily   *If you need a refill on your cardiac medications before your next appointment, please call your pharmacy*  Lab Work:  Your provider would like for you to have following labs drawn today [facility].    Your provider would like for you to return in 2-3 Weeks to have the following labs drawn: [facility].   Please go to [facility] [address] [address], [place] [address] You do not need an appointment.  They are open from 8 am- 4:30 pm.  Lunch from 1:00 pm- 2:00 pm You Do not need to be fasting.   You may also go to one of the following [facility]:  [address] [place], [place] [address] Phone: [phone] Lab hours: Mon-Fri 8 am- 5 pm    Lunch 12 pm- 1 pm  [address] [place],  [place]  [address]  [place]  Phone: [phone] Lab hours: 7 am- 4 pm Lunch 12 pm-1 pm   [address] [place],  [place]  [address]  [place]  Phone: [phone] Lab hours: Mon-Fri 8 am- 5 pm    Lunch 12 pm- 1 pm  If you have labs (blood work) drawn today and your tests are completely normal, you will receive your results only by:  MyChart Message (if you have MyChart) OR  A paper copy in the mail If you have any lab test that is abnormal or we need to change your treatment, we will call you to review the results.  Testing/Procedures:  None ordered at this time   Referrals:  None ordered at this time   Follow-Up:  At [facility], you and your health needs are our priority.  As part of our continuing mission to provide you with exceptional heart care, our providers are all part of one team.  This team includes your primary Cardiologist (physician) and Advanced Practice Providers or APPs (Physician Assistants and Nurse Practitioners) who all work together to provide you with the care you need, when you need it.  Your next appointment:   3  month(s)  Provider:    Barnie Hila, NP    We recommend signing up for the patient portal called MyChart.  Sign up information is provided on this After Visit Summary.  MyChart is used to connect with patients for Virtual Visits (Telemedicine).  Patients are able to view lab/test results, encounter notes, upcoming appointments, etc.  Non-urgent messages can be sent to your provider as well.   To learn more about what you can do with MyChart, go to [email or web address].

## 2024-02-20 LAB — BASIC METABOLIC PANEL WITH GFR
BUN/Creatinine Ratio: 10 — ABNORMAL LOW (ref 12–28)
BUN: 10 mg/dL (ref 8–27)
CO2: 25 mmol/L (ref 20–29)
Calcium: 11 mg/dL — ABNORMAL HIGH (ref 8.7–10.3)
Chloride: 105 mmol/L (ref 96–106)
Creatinine, Ser: 1.05 mg/dL — ABNORMAL HIGH (ref 0.57–1.00)
Glucose: 88 mg/dL (ref 70–99)
Potassium: 4.3 mmol/L (ref 3.5–5.2)
Sodium: 143 mmol/L (ref 134–144)
eGFR: 58 mL/min/{1.73_m2} — ABNORMAL LOW

## 2024-02-21 ENCOUNTER — Ambulatory Visit: Payer: Self-pay | Admitting: Student

## 2024-02-24 NOTE — Progress Notes (Signed)
 Last read by Wynona LITTIE Moles at 12:06AM on 02/23/2024.

## 2024-02-25 ENCOUNTER — Ambulatory Visit (HOSPITAL_COMMUNITY): Admission: RE | Admit: 2024-02-25 | Source: Ambulatory Visit

## 2024-02-25 DIAGNOSIS — E213 Hyperparathyroidism, unspecified: Secondary | ICD-10-CM

## 2024-02-25 MED ORDER — TECHNETIUM TC 99M SESTAMIBI - CARDIOLITE
25.0000 | Freq: Once | INTRAVENOUS | Status: AC | PRN
  Administered 2024-02-25: 26.6 via INTRAVENOUS

## 2024-05-20 ENCOUNTER — Ambulatory Visit: Admitting: Student
